# Patient Record
Sex: Female | Born: 1935 | Race: White | Hispanic: No | Smoking: Never smoker
Health system: Southern US, Community
[De-identification: ages and names within clinical notes are randomized; demographics above are authoritative.]

## PROBLEM LIST (undated history)

## (undated) DIAGNOSIS — C189 Malignant neoplasm of colon, unspecified: Secondary | ICD-10-CM

## (undated) DIAGNOSIS — J449 Chronic obstructive pulmonary disease, unspecified: Secondary | ICD-10-CM

## (undated) DIAGNOSIS — D509 Iron deficiency anemia, unspecified: Secondary | ICD-10-CM

## (undated) DIAGNOSIS — I739 Peripheral vascular disease, unspecified: Secondary | ICD-10-CM

## (undated) DIAGNOSIS — I10 Essential (primary) hypertension: Secondary | ICD-10-CM

## (undated) DIAGNOSIS — E785 Hyperlipidemia, unspecified: Secondary | ICD-10-CM

## (undated) DIAGNOSIS — G4730 Sleep apnea, unspecified: Secondary | ICD-10-CM

## (undated) DIAGNOSIS — I2510 Atherosclerotic heart disease of native coronary artery without angina pectoris: Secondary | ICD-10-CM

## (undated) DIAGNOSIS — N289 Disorder of kidney and ureter, unspecified: Secondary | ICD-10-CM

## (undated) DIAGNOSIS — K219 Gastro-esophageal reflux disease without esophagitis: Secondary | ICD-10-CM

## (undated) DIAGNOSIS — I509 Heart failure, unspecified: Secondary | ICD-10-CM

## (undated) DIAGNOSIS — G629 Polyneuropathy, unspecified: Secondary | ICD-10-CM

## (undated) DIAGNOSIS — F329 Major depressive disorder, single episode, unspecified: Secondary | ICD-10-CM

## (undated) HISTORY — PX: MITRAL VALVE REPLACEMENT: SHX147

## (undated) HISTORY — PX: HERNIA REPAIR: SHX51

## (undated) HISTORY — PX: CARDIAC SURGERY: SHX584

---

## 2020-07-01 ENCOUNTER — Inpatient Hospital Stay
Admission: EM | Admit: 2020-07-01 | Discharge: 2020-07-05 | DRG: 125 | Disposition: A | Discharge status: Skilled Nursing Facility | Payer: Medicare Other | Source: Ambulatory Visit | Attending: Internal Medicine | Admitting: Internal Medicine

## 2020-07-01 ENCOUNTER — Emergency Department: Payer: Medicare Other

## 2020-07-01 ENCOUNTER — Other Ambulatory Visit: Payer: Self-pay

## 2020-07-01 ENCOUNTER — Inpatient Hospital Stay: Payer: Medicare Other

## 2020-07-01 DIAGNOSIS — Z888 Allergy status to other drugs, medicaments and biological substances status: Secondary | ICD-10-CM

## 2020-07-01 DIAGNOSIS — N289 Disorder of kidney and ureter, unspecified: Secondary | ICD-10-CM | POA: Diagnosis present

## 2020-07-01 DIAGNOSIS — H5462 Unqualified visual loss, left eye, normal vision right eye: Secondary | ICD-10-CM | POA: Diagnosis present

## 2020-07-01 DIAGNOSIS — J449 Chronic obstructive pulmonary disease, unspecified: Secondary | ICD-10-CM | POA: Diagnosis present

## 2020-07-01 DIAGNOSIS — Z880 Allergy status to penicillin: Secondary | ICD-10-CM

## 2020-07-01 DIAGNOSIS — Z85038 Personal history of other malignant neoplasm of large intestine: Secondary | ICD-10-CM | POA: Diagnosis not present

## 2020-07-01 DIAGNOSIS — G629 Polyneuropathy, unspecified: Secondary | ICD-10-CM | POA: Diagnosis present

## 2020-07-01 DIAGNOSIS — I509 Heart failure, unspecified: Secondary | ICD-10-CM | POA: Diagnosis present

## 2020-07-01 DIAGNOSIS — Z885 Allergy status to narcotic agent status: Secondary | ICD-10-CM

## 2020-07-01 DIAGNOSIS — F329 Major depressive disorder, single episode, unspecified: Secondary | ICD-10-CM | POA: Diagnosis present

## 2020-07-01 DIAGNOSIS — I2510 Atherosclerotic heart disease of native coronary artery without angina pectoris: Secondary | ICD-10-CM | POA: Diagnosis present

## 2020-07-01 DIAGNOSIS — Z7901 Long term (current) use of anticoagulants: Secondary | ICD-10-CM | POA: Diagnosis not present

## 2020-07-01 DIAGNOSIS — I739 Peripheral vascular disease, unspecified: Secondary | ICD-10-CM | POA: Diagnosis present

## 2020-07-01 DIAGNOSIS — Z79899 Other long term (current) drug therapy: Secondary | ICD-10-CM | POA: Diagnosis not present

## 2020-07-01 DIAGNOSIS — K219 Gastro-esophageal reflux disease without esophagitis: Secondary | ICD-10-CM | POA: Diagnosis present

## 2020-07-01 DIAGNOSIS — R791 Abnormal coagulation profile: Secondary | ICD-10-CM | POA: Diagnosis present

## 2020-07-01 DIAGNOSIS — Z952 Presence of prosthetic heart valve: Secondary | ICD-10-CM

## 2020-07-01 DIAGNOSIS — Z8673 Personal history of transient ischemic attack (TIA), and cerebral infarction without residual deficits: Secondary | ICD-10-CM | POA: Diagnosis not present

## 2020-07-01 DIAGNOSIS — H34232 Retinal artery branch occlusion, left eye: Secondary | ICD-10-CM | POA: Diagnosis not present

## 2020-07-01 DIAGNOSIS — Z20822 Contact with and (suspected) exposure to covid-19: Secondary | ICD-10-CM | POA: Diagnosis present

## 2020-07-01 DIAGNOSIS — H539 Unspecified visual disturbance: Secondary | ICD-10-CM | POA: Diagnosis present

## 2020-07-01 DIAGNOSIS — G4733 Obstructive sleep apnea (adult) (pediatric): Secondary | ICD-10-CM | POA: Diagnosis present

## 2020-07-01 DIAGNOSIS — D509 Iron deficiency anemia, unspecified: Secondary | ICD-10-CM | POA: Diagnosis present

## 2020-07-01 DIAGNOSIS — E785 Hyperlipidemia, unspecified: Secondary | ICD-10-CM | POA: Diagnosis present

## 2020-07-01 HISTORY — DX: Malignant neoplasm of colon, unspecified: C18.9

## 2020-07-01 HISTORY — DX: Major depressive disorder, single episode, unspecified: F32.9

## 2020-07-01 HISTORY — DX: Disorder of kidney and ureter, unspecified: N28.9

## 2020-07-01 HISTORY — DX: Essential (primary) hypertension: I10

## 2020-07-01 HISTORY — DX: Atherosclerotic heart disease of native coronary artery without angina pectoris: I25.10

## 2020-07-01 HISTORY — DX: Peripheral vascular disease, unspecified: I73.9

## 2020-07-01 HISTORY — DX: Hyperlipidemia, unspecified: E78.5

## 2020-07-01 HISTORY — DX: Polyneuropathy, unspecified: G62.9

## 2020-07-01 HISTORY — DX: Iron deficiency anemia, unspecified: D50.9

## 2020-07-01 HISTORY — DX: Sleep apnea, unspecified: G47.30

## 2020-07-01 HISTORY — DX: Heart failure, unspecified: I50.9

## 2020-07-01 HISTORY — DX: Chronic obstructive pulmonary disease, unspecified: J44.9

## 2020-07-01 HISTORY — DX: Gastro-esophageal reflux disease without esophagitis: K21.9

## 2020-07-01 LAB — COMPREHENSIVE METABOLIC PANEL
ALT: 22 U/L (ref 0–44)
AST: 30 U/L (ref 15–41)
Albumin: 4.5 g/dL (ref 3.5–5.0)
Alkaline Phosphatase: 42 U/L (ref 38–126)
Anion gap: 10 (ref 5–15)
BUN: 15 mg/dL (ref 8–23)
CO2: 28 mmol/L (ref 22–32)
Calcium: 9.8 mg/dL (ref 8.9–10.3)
Chloride: 102 mmol/L (ref 98–111)
Creatinine, Ser: 0.8 mg/dL (ref 0.44–1.00)
GFR, Estimated: >60 mL/min (ref >60)
Glucose, Bld: 135 mg/dL — ABNORMAL HIGH (ref 70–99)
Potassium: 4 mmol/L (ref 3.5–5.1)
Sodium: 140 mmol/L (ref 135–145)
Total Bilirubin: 1 mg/dL (ref 0.3–1.2)
Total Protein: 8 g/dL (ref 6.5–8.1)

## 2020-07-01 LAB — CBC
HCT: 38.3 % (ref 36.0–46.0)
Hemoglobin: 12.5 g/dL (ref 12.0–15.0)
MCH: 31 pg (ref 26.0–34.0)
MCHC: 32.6 g/dL (ref 30.0–36.0)
MCV: 95 fL (ref 80.0–100.0)
Platelets: 226 10*3/uL (ref 150–400)
RBC: 4.03 MIL/uL (ref 3.87–5.11)
RDW: 13.6 % (ref 11.5–15.5)
WBC: 6.5 10*3/uL (ref 4.0–10.5)
nRBC: 0 % (ref 0.0–0.2)

## 2020-07-01 LAB — DIFFERENTIAL
Abs Immature Granulocytes: 0.02 10*3/uL (ref 0.00–0.07)
Basophils Absolute: 0 10*3/uL (ref 0.0–0.1)
Basophils Relative: 1 %
Eosinophils Absolute: 0.1 10*3/uL (ref 0.0–0.5)
Eosinophils Relative: 1 %
Immature Granulocytes: 0 %
Lymphocytes Relative: 30 %
Lymphs Abs: 2 10*3/uL (ref 0.7–4.0)
Monocytes Absolute: 0.6 10*3/uL (ref 0.1–1.0)
Monocytes Relative: 9 %
Neutro Abs: 3.9 10*3/uL (ref 1.7–7.7)
Neutrophils Relative %: 59 %

## 2020-07-01 LAB — SEDIMENTATION RATE: Sed Rate: 14 mm/hr (ref 0–30)

## 2020-07-01 LAB — APTT: aPTT: 39 seconds — ABNORMAL HIGH (ref 24–36)

## 2020-07-01 LAB — RESP PANEL BY RT-PCR (FLU A&B, COVID) ARPGX2
Influenza A by PCR: NEGATIVE
Influenza B by PCR: NEGATIVE
SARS Coronavirus 2 by RT PCR: NEGATIVE

## 2020-07-01 LAB — CBG MONITORING, ED: Glucose-Capillary: 119 mg/dL — ABNORMAL HIGH (ref 70–99)

## 2020-07-01 LAB — PROTIME-INR
INR: 2.2 — ABNORMAL HIGH (ref 0.8–1.2)
Prothrombin Time: 23.7 seconds — ABNORMAL HIGH (ref 11.4–15.2)

## 2020-07-01 MED ORDER — ENOXAPARIN SODIUM 100 MG/ML ~~LOC~~ SOLN
1.0000 mg/kg | Freq: Two times a day (BID) | SUBCUTANEOUS | Status: DC
  Administered 2020-07-01 – 2020-07-02 (×2): 95 mg via SUBCUTANEOUS
  Filled 2020-07-01 (×3): qty 1

## 2020-07-01 MED ORDER — ACETAMINOPHEN 650 MG RE SUPP: 650.0000 mg | RECTAL | Status: DC | PRN

## 2020-07-01 MED ORDER — ACETAMINOPHEN 160 MG/5ML PO SOLN
650.0000 mg | ORAL | Status: DC | PRN
  Filled 2020-07-01: qty 20.3

## 2020-07-01 MED ORDER — SENNOSIDES-DOCUSATE SODIUM 8.6-50 MG PO TABS: 1.0000 | ORAL_TABLET | Freq: Every evening | ORAL | Status: DC | PRN

## 2020-07-01 MED ORDER — STROKE: EARLY STAGES OF RECOVERY BOOK: Freq: Once | Status: DC

## 2020-07-01 MED ORDER — ASPIRIN 300 MG RE SUPP: 300.0000 mg | Freq: Every day | RECTAL | Status: DC

## 2020-07-01 MED ORDER — ACETAMINOPHEN 325 MG PO TABS
650.0000 mg | ORAL_TABLET | ORAL | Status: DC | PRN
  Administered 2020-07-02 – 2020-07-04 (×3): 650 mg via ORAL
  Filled 2020-07-01 (×3): qty 2

## 2020-07-01 MED ORDER — ASPIRIN 325 MG PO TABS
325.0000 mg | ORAL_TABLET | Freq: Every day | ORAL | Status: DC
  Administered 2020-07-01 – 2020-07-02 (×2): 325 mg via ORAL
  Filled 2020-07-01 (×2): qty 1

## 2020-07-01 MED ORDER — SODIUM CHLORIDE 0.9% FLUSH
3.0000 mL | Freq: Once | INTRAVENOUS | Status: AC
Start: 2020-07-01 — End: 2020-07-04
  Administered 2020-07-04: 17:00:00 3 mL via INTRAVENOUS

## 2020-07-01 MED ORDER — SODIUM CHLORIDE 0.9 % IV SOLN: INTRAVENOUS | Status: DC

## 2020-07-01 NOTE — ED Notes (Signed)
meds given  Iv fluids infusing.

## 2020-07-01 NOTE — ED Notes (Signed)
Iv infiltrated.  Iv removed.

## 2020-07-01 NOTE — ED Triage Notes (Signed)
Pt was sent to the ED via POV from [facility] with c/o partial vision loss in the left eye , the lower half is blocked since last week. Denies any pain or other sx. No weakness or change in speech

## 2020-07-01 NOTE — ED Notes (Signed)
Pt return from mri.  Pt alert  Speech clear.  Pt in hallway bed.  Iv in place.

## 2020-07-01 NOTE — ED Provider Notes (Signed)
[facility] Emergency Department Provider Note    ____________________________________________   I have reviewed the triage vital signs and the nursing notes.   HISTORY  Chief Complaint Loss of Vision   History limited by: Not Limited   HPI Jennifer Guerra is a 86 y.o. female who presents to the emergency department today from [facility] because of concerns for possible branch retinal artery occlusion.  She did go to the [facility] today because of vision loss in the left eye.  She states roughly 1 week ago she noticed that she lost vision to the lower half of her field of vision in her left eye.  This was not accompanied by any pain.  The patient denies any other acute weakness or numbness in her extremities.  Went to [facility] today and after evaluation they were concerned for BRAO and sent her to the ED for further work up and evaluation.  Records reviewed. Per medical record review patient has a history of COPD, CHF, HLD, HTN. Mitral valve replacement.   Past Medical History:  Diagnosis Date  . CHF (congestive heart failure) ([place])   . Colon cancer ([place])   . COPD (chronic obstructive pulmonary disease) ([place])   . Coronary artery disease   . GERD (gastroesophageal reflux disease)   . Hyperlipemia   . Hypertension   . Iron deficiency anemia   . Major depression   . Neuropathy   . Peripheral vascular disease ([place])   . Renal disorder   . Sleep apnea     There are no problems to display for this patient.   Past Surgical History:  Procedure Laterality Date  . CARDIAC SURGERY    . HERNIA REPAIR    . MITRAL VALVE REPLACEMENT      Prior to Admission medications   Not on File    Allergies Phenergan [promethazine], Codeine, and Penicillins  No family history on file.  Social History Social History   Tobacco Use  . Smoking status: Never Smoker  . Smokeless tobacco: Never Used  Substance Use Topics  . Alcohol use: Not  Currently  . Drug use: Not Currently    Review of Systems Constitutional: No fever/chills Eyes: Positive for left vision change. ENT: No sore throat. Cardiovascular: Denies chest pain. Respiratory: Denies shortness of breath. Gastrointestinal: No abdominal pain.  No nausea, no vomiting.  No diarrhea.   Genitourinary: Negative for dysuria. Musculoskeletal: Negative for back pain. Skin: Negative for rash. Neurological: Negative for headaches, focal weakness or numbness.  ____________________________________________   PHYSICAL EXAM:  VITAL SIGNS: ED Triage Vitals  Enc Vitals Group     BP 07/01/20 1344 (!) 195/56     Pulse Rate 07/01/20 1344 66     Resp 07/01/20 1344 20     Temp 07/01/20 1344 98.7 F (37.1 C)     Temp Source 07/01/20 1344 Oral     SpO2 07/01/20 1344 98 %     Weight 07/01/20 1344 210 lb (95.3 kg)     Height 07/01/20 1344 5\' 2"  (1.575 m)     Head Circumference --      Peak Flow --      Pain Score 07/01/20 1400 0   Constitutional: Alert and oriented.  Eyes: Conjunctivae are normal.  ENT      Head: Normocephalic and atraumatic.      Nose: No congestion/rhinnorhea.      Mouth/Throat: Mucous membranes are moist.      Neck: No stridor. Hematological/Lymphatic/Immunilogical:  No cervical lymphadenopathy. Cardiovascular: Normal rate, regular rhythm.  No murmurs, rubs, or gallops.  Respiratory: Normal respiratory effort without tachypnea nor retractions. Breath sounds are clear and equal bilaterally. No wheezes/rales/rhonchi. Gastrointestinal: Soft and non tender. No rebound. No guarding.  Genitourinary: Deferred Musculoskeletal: Normal range of motion in all extremities. No lower extremity edema. Neurologic:  Normal speech and language. No gross focal neurologic deficits are appreciated.  Skin:  Skin is warm, dry and intact. No rash noted. Psychiatric: Mood and affect are normal. Speech and behavior are normal. Patient exhibits appropriate insight and  judgment.  ____________________________________________    LABS (pertinent positives/negatives)  CMP wnl except glu 135 CBC wbc 6.5, hgb 12.5, plt 226 INR 2.2 ____________________________________________   EKG  I, Nance Pear, attending physician, personally viewed and interpreted this EKG  EKG Time: 1353 Rate: 69 Rhythm: sinus rhythm vs possible accelerated junctional Axis: normal Intervals: qtc 475 QRS: narrow, q waves v1 ST changes: no st elevation Impression: abnormal ekg  ____________________________________________    RADIOLOGY  CT head No acute abnormality  MRI brain No acute intracranial process, chronic left thalamic lacunar insult. ____________________________________________   PROCEDURES  Procedures  ____________________________________________   INITIAL IMPRESSION / ASSESSMENT AND PLAN / ED COURSE  Pertinent labs & imaging results that were available during my care of the patient were reviewed by me and considered in my medical decision making (see chart for details).  Patient presented to the emergency department today because of concerns for possible branch retinal artery occlusion found at [facility] today where she went for left eye vision change.  MRI performed from triage showed an old stroke but no acute stroke.  However given concern for BRA O did discuss with patient for stroke work-up.  She does have history of mitral valve replacement and is on warfarin INR today 2.2.  Patient states her goal is 2.5-3.5.  Will plan on admission for further stroke risk work-up.  ____________________________________________   FINAL CLINICAL IMPRESSION(S) / ED DIAGNOSES  Final diagnoses:  Vision changes     Note: This dictation was prepared with Dragon dictation. Any transcriptional errors that result from this process are unintentional     Nance Pear, MD 07/01/20 2057

## 2020-07-01 NOTE — ED Notes (Signed)
Pt talking on cell phone in hallway bed.  Pt alert  Speech clear.

## 2020-07-01 NOTE — Consult Note (Signed)
ANTICOAGULATION CONSULT NOTE  Pharmacy Consult for Enoxaparin Indication: hx of MVR  Allergies  Allergen Reactions   Phenergan [Promethazine] Anaphylaxis   Codeine Nausea And Vomiting   Penicillins Rash    Patient Measurements: Height: 5\' 2"  (157.5 cm) Weight: 95.3 kg (210 lb) IBW/kg (Calculated) : 50.1  Vital Signs: Temp: 98.7 F (37.1 C) (12/14 1344) Temp Source: Oral (12/14 1344) BP: 204/79 (12/14 2035) Pulse Rate: 69 (12/14 2035)  Labs: Recent Labs    07/01/20 1354  HGB 12.5  HCT 38.3  PLT 226  APTT 39*  LABPROT 23.7*  INR 2.2*  CREATININE 0.80    Estimated Creatinine Clearance: 54.3 mL/min (by C-G formula based on SCr of 0.8 mg/dL).   Medical History: Past Medical History:  Diagnosis Date   CHF (congestive heart failure) (HCC)    Colon cancer (H[facility])    COPD (chronic obstructive pulmonary disease) (H[facility])    Coronary artery disease    GERD (gastroesophageal reflux disease)    Hyperlipemia    Hypertension    Iron deficiency anemia    Major depression    Neuropathy    Peripheral vascular disease (H[facility])    Renal disorder    Sleep apnea     Medications:  (Not in a hospital admission)  Scheduled:   sodium chloride flush  3 mL Intravenous Once   Infusions:   PRN:  Anti-infectives (From admission, onward)   None      Assessment: Pharmacy consulted to start enoxaparin with a hx of MVR. Concerns for possible branch retinal artery occulusion. Med rec not reviewed. F/u with warfarin dose. INR 2. CBC stable.   Goal of Therapy:  Monitor platelets by anticoagulation protocol: Yes   Plan:  Lovenox 1 mg/kg q12H.   Oswald Hillock, PharmD, BCPS 07/01/2020,9:55 PM

## 2020-07-01 NOTE — ED Notes (Signed)
Iv placed by iv team.  Lab called for labs.

## 2020-07-01 NOTE — H&P (Signed)
History and Physical   [facility] [place] [facility] Admission History and Physical [employer], D.O.    Patient Name: Jennifer Guerra MR#: [id number] Date of Birth: 04-10-1932 Date of Admission: 07/01/2020  Referring MD/NP/PA: Dr. Archie Balboa Primary Care Physician: No primary care provider on file.  Chief Complaint:  Chief Complaint  Patient presents with  . Loss of Vision    HPI: Raedyn Klinck is a 86 y.o. female with a known history of CHF, COPD, CAD, HTN, HLD, GERD, IDA, depression, PVD, OSA, MVR on Coumadin (goal INR 2.5-3.5, today 2.2).  presents to the emergency department for evaluation of vision loss.  Patient was in a usual state of health until one week ago she noticed painless left lower vision field loss. . She saw the eye doctor today who sent her to ER for CVA workup and concern for arterial occlusion.   Only complaint at present is diffuse body aches from sitting in chair,.   Patient denies fevers/chills, weakness, dizziness, chest pain, shortness of breath, N/V/C/D, abdominal pain, dysuria/frequency, changes in mental status.    Otherwise there has been no change in status. Patient has been taking medication as prescribed and there has been no recent change in medication or diet.  No recent antibiotics.  There has been no recent illness, hospitalizations, travel or sick contacts.    EMS/ED Course:Medical admission has been requested for further management of CVA workup.  Review of Systems:  CONSTITUTIONAL: No fever/chills, fatigue, weakness, weight gain/loss, headache. EYES: Positive left eye vision loss. No blurry or double vision. ENT: No tinnitus, postnasal drip, redness or soreness of the oropharynx. RESPIRATORY: No cough, dyspnea, wheeze.  No hemoptysis.  CARDIOVASCULAR: No chest pain, palpitations, syncope, orthopnea. No lower extremity edema.  GASTROINTESTINAL: No nausea, vomiting, abdominal pain, diarrhea, constipation.  No hematemesis, melena or  hematochezia. GENITOURINARY: No dysuria, frequency, hematuria. ENDOCRINE: No polyuria or nocturia. No heat or cold intolerance. HEMATOLOGY: No anemia, bruising, bleeding. INTEGUMENTARY: No rashes, ulcers, lesions. MUSCULOSKELETAL: No arthritis, gout, dyspnea. NEUROLOGIC: No numbness, tingling, ataxia, seizure-type activity, weakness. PSYCHIATRIC: No anxiety, depression, insomnia.   Past Medical History:  Diagnosis Date  . CHF (congestive heart failure) ([place])   . Colon cancer ([place])   . COPD (chronic obstructive pulmonary disease) ([place])   . Coronary artery disease   . GERD (gastroesophageal reflux disease)   . Hyperlipemia   . Hypertension   . Iron deficiency anemia   . Major depression   . Neuropathy   . Peripheral vascular disease ([place])   . Renal disorder   . Sleep apnea     Past Surgical History:  Procedure Laterality Date  . CARDIAC SURGERY    . HERNIA REPAIR    . MITRAL VALVE REPLACEMENT       reports that she has never smoked. She has never used smokeless tobacco. She reports previous alcohol use. She reports previous drug use.  Allergies  Allergen Reactions  . Phenergan [Promethazine] Anaphylaxis  . Codeine Nausea And Vomiting  . Penicillins Rash    No family history on file.  Prior to Admission medications   Not on File    Physical Exam: Vitals:   07/01/20 1344 07/01/20 2035  BP: (!) 195/56 (!) 204/79  Pulse: 66 69  Resp: 20 20  Temp: 98.7 F (37.1 C)   TempSrc: Oral   SpO2: 98% 98%  Weight: 95.3 kg   Height: 5\' 2"  (1.575 m)     GENERAL: 86 y.o.-year-old female patient, well-developed, well-nourished lying  in the bed in no acute distress.  Pleasant and cooperative.   HEENT: Head atraumatic, normocephalic. Pupils equal. Mucus membranes moist. NECK: Supple. No JVD. CHEST: Normal breath sounds bilaterally. No wheezing, rales, rhonchi or crackles. No use of accessory muscles of respiration.  No reproducible chest wall tenderness.  CARDIOVASCULAR:  S1, S2 normal. No murmurs, rubs, or gallops. Cap refill <2 seconds. Pulses intact distally.  ABDOMEN: Soft, nondistended, nontender. No rebound, guarding, rigidity. Normoactive bowel sounds present in all four quadrants.  EXTREMITIES: No pedal edema, cyanosis, or clubbing. No calf tenderness or Homan's sign.  NEUROLOGIC: The patient is alert and oriented x 3. Cranial nerves II through XII are grossly intact with no focal sensorimotor deficit. PSYCHIATRIC:  Normal affect, mood, thought content. SKIN: Warm, dry, and intact without obvious rash, lesion, or ulcer.    Labs on Admission:  CBC: Recent Labs  Lab 07/01/20 1354  WBC 6.5  NEUTROABS 3.9  HGB 12.5  HCT 38.3  MCV 95.0  PLT 741   Basic Metabolic Panel: Recent Labs  Lab 07/01/20 1354  NA 140  K 4.0  CL 102  CO2 28  GLUCOSE 135*  BUN 15  CREATININE 0.80  CALCIUM 9.8   GFR: Estimated Creatinine Clearance: 54.3 mL/min (by C-G formula based on SCr of 0.8 mg/dL). Liver Function Tests: Recent Labs  Lab 07/01/20 1354  AST 30  ALT 22  ALKPHOS 42  BILITOT 1.0  PROT 8.0  ALBUMIN 4.5   No results for input(s): LIPASE, AMYLASE in the last 168 hours. No results for input(s): AMMONIA in the last 168 hours. Coagulation Profile: Recent Labs  Lab 07/01/20 1354  INR 2.2*   Cardiac Enzymes: No results for input(s): CKTOTAL, CKMB, CKMBINDEX, TROPONINI in the last 168 hours. BNP (last 3 results) No results for input(s): PROBNP in the last 8760 hours. HbA1C: No results for input(s): HGBA1C in the last 72 hours. CBG: Recent Labs  Lab 07/01/20 1358  GLUCAP 119*   Lipid Profile: No results for input(s): CHOL, HDL, LDLCALC, TRIG, CHOLHDL, LDLDIRECT in the last 72 hours. Thyroid Function Tests: No results for input(s): TSH, T4TOTAL, FREET4, T3FREE, THYROIDAB in the last 72 hours. Anemia Panel: No results for input(s): VITAMINB12, FOLATE, FERRITIN, TIBC, IRON, RETICCTPCT in the last 72 hours. Urine analysis: No results  found for: COLORURINE, APPEARANCEUR, LABSPEC, PHURINE, GLUCOSEU, HGBUR, BILIRUBINUR, KETONESUR, PROTEINUR, UROBILINOGEN, NITRITE, LEUKOCYTESUR Sepsis Labs: @LABRCNTIP (procalcitonin:4,lacticidven:4) )No results found for this or any previous visit (from the past 240 hour(s)).   Radiological Exams on Admission: CT HEAD WO CONTRAST  Result Date: 07/01/2020 CLINICAL DATA:  Neuro deficit, acute, stroke suspected. Additional history provided: Partial vision loss left eye (symptoms since last week). EXAM: CT HEAD WITHOUT CONTRAST TECHNIQUE: Contiguous axial images were obtained from the base of the skull through the vertex without intravenous contrast. COMPARISON:  No pertinent prior exams available for comparison. FINDINGS: Brain: Mild cerebral and cerebellar atrophy. There is no acute intracranial hemorrhage. No demarcated cortical infarct. No extra-axial fluid collection. No evidence of intracranial mass. No midline shift. Vascular: No hyperdense vessel.  Atherosclerotic calcifications. Skull: Normal. Negative for fracture or focal lesion. Sinuses/Orbits: Visualized orbits show no acute finding. No significant paranasal sinus disease at the imaged levels. IMPRESSION: No CT evidence of acute intracranial abnormality. Mild generalized parenchymal atrophy. Electronically Signed   By: Kellie Simmering DO   On: 07/01/2020 14:52   MR BRAIN WO CONTRAST  Result Date: 07/01/2020 CLINICAL DATA:  Neuro deficit, acute, stroke suspected partial loss of vision  since last week EXAM: MRI HEAD WITHOUT CONTRAST TECHNIQUE: Multiplanar, multiecho pulse sequences of the brain and surrounding structures were obtained without intravenous contrast. COMPARISON:  07/01/2020 FINDINGS: Brain: No diffusion-weighted signal abnormality. No intracranial hemorrhage. No midline shift, ventriculomegaly or extra-axial fluid collection. No mass lesion. Minimal chronic microvascular ischemic changes. Chronic left thalamic lacunar insult. Vascular:  Normal flow voids. Skull and upper cervical spine: Normal marrow signal. Sinuses/Orbits: Sequela of bilateral lens replacement. Pneumatized paranasal sinuses. Trace mastoid free fluid. Other: None. IMPRESSION: No acute intracranial process. Chronic left thalamic lacunar insult. Minimal chronic microvascular ischemic changes. Electronically Signed   By: Primitivo Gauze M.D.   On: 07/01/2020 19:36    EKG: Normal sinus rhythm at 69 bpm with normal axis and nonspecific ST-T wave changes.   Assessment/Plan  This is a 86 y.o. female with a history of CHF, COPD, CAD, HTN, HLD, GERD, IDA, depression, PVD, OSA, MVR on Coumadin (goal INR 2.5-3.5, today 2.2).   now being admitted with:  #. L visual field loss in setting of MVR and subther INR, rule out CVA -  - Admit telemetry observation for neuro workup including: - Studies: MRA/MRI, Echo, Carotids - Labs: CBC, BMP, Lipids, TFTs, A1C - Nursing: Neurochecks, O2, dysphagia screen, permissive hypertension.  - Consults: Neurology, PT/OT, S/S consults.  - Meds: Daily aspirin 81mg .   - Fluids: IVNS@75cc /hr.   - Routine DVT Px: with Lovenox, SCDs, early ambulation   #. History of CHF, COPD, CAD, HTN, HLD, GERD, IDA, depression, PVD, OSA, MVR on Coumadin (goal INR 2.5-3.5, today 2.2).    - Reconcile home meds.   Admission status: IP IV Fluids: NS Diet/Nutrition: Heart healthy Consults called: Neuro to be called  DVT Px: Lovenox, SCDs and early ambulation. Code Status: Full Code  Disposition Plan: To home in 1-2 days  All the records are reviewed and case discussed with ED provider. Management plans discussed with the patient and/or family who express understanding and agree with plan of care.  Lamarion Mcevers D.O. on 07/01/2020 at 9:04 PM CC: Primary care physician; No primary care provider on file.   07/01/2020, 9:04 PM

## 2020-07-02 ENCOUNTER — Inpatient Hospital Stay
Admit: 2020-07-02 | Discharge: 2020-07-02 | Disposition: A | Discharge status: Home / Self Care | Payer: Medicare Other | Attending: Internal Medicine | Admitting: Internal Medicine

## 2020-07-02 ENCOUNTER — Encounter: Payer: Self-pay | Admitting: Internal Medicine

## 2020-07-02 ENCOUNTER — Inpatient Hospital Stay: Payer: Medicare Other

## 2020-07-02 DIAGNOSIS — H34232 Retinal artery branch occlusion, left eye: Secondary | ICD-10-CM

## 2020-07-02 DIAGNOSIS — H539 Unspecified visual disturbance: Secondary | ICD-10-CM

## 2020-07-02 LAB — URINALYSIS, COMPLETE (UACMP) WITH MICROSCOPIC
Bilirubin Urine: NEGATIVE
Glucose, UA: NEGATIVE mg/dL
Ketones, ur: NEGATIVE mg/dL
Leukocytes,Ua: NEGATIVE
Nitrite: POSITIVE — AB
Protein, ur: NEGATIVE mg/dL
Specific Gravity, Urine: 1.01 (ref 1.005–1.030)
Squamous Epithelial / HPF: NONE SEEN (ref 0–5)
WBC, UA: NONE SEEN WBC/hpf (ref 0–5)
pH: 7 (ref 5.0–8.0)

## 2020-07-02 LAB — C-REACTIVE PROTEIN: CRP: 0.6 mg/dL (ref <1.0)

## 2020-07-02 LAB — URINE DRUG SCREEN, QUALITATIVE (ARMC ONLY)
Amphetamines, Ur Screen: NOT DETECTED
Barbiturates, Ur Screen: NOT DETECTED
Benzodiazepine, Ur Scrn: NOT DETECTED
Cannabinoid 50 Ng, Ur ~~LOC~~: NOT DETECTED
Cocaine Metabolite,Ur ~~LOC~~: NOT DETECTED
MDMA (Ecstasy)Ur Screen: NOT DETECTED
Methadone Scn, Ur: NOT DETECTED
Opiate, Ur Screen: NOT DETECTED
Phencyclidine (PCP) Ur S: NOT DETECTED
Tricyclic, Ur Screen: NOT DETECTED

## 2020-07-02 LAB — PROTIME-INR
INR: 2.1 — ABNORMAL HIGH (ref 0.8–1.2)
Prothrombin Time: 22.6 seconds — ABNORMAL HIGH (ref 11.4–15.2)

## 2020-07-02 LAB — HEMOGLOBIN A1C
Hgb A1c MFr Bld: 5.4 % (ref 4.8–5.6)
Mean Plasma Glucose: 108.28 mg/dL

## 2020-07-02 LAB — LIPID PANEL
Cholesterol: 169 mg/dL (ref 0–200)
HDL: 48 mg/dL (ref >40)
LDL Cholesterol: 88 mg/dL (ref 0–99)
Total CHOL/HDL Ratio: 3.5 RATIO
Triglycerides: 167 mg/dL — ABNORMAL HIGH (ref <150)
VLDL: 33 mg/dL (ref 0–40)

## 2020-07-02 MED ORDER — MELATONIN 5 MG PO TABS
10.0000 mg | ORAL_TABLET | Freq: Every day | ORAL | Status: DC
  Administered 2020-07-02 – 2020-07-04 (×3): 10 mg via ORAL
  Filled 2020-07-02 (×3): qty 2

## 2020-07-02 MED ORDER — ATORVASTATIN CALCIUM 20 MG PO TABS
20.0000 mg | ORAL_TABLET | Freq: Every day | ORAL | Status: DC
  Administered 2020-07-02 – 2020-07-04 (×3): 20 mg via ORAL
  Filled 2020-07-02 (×3): qty 1

## 2020-07-02 MED ORDER — BUSPIRONE HCL 10 MG PO TABS
10.0000 mg | ORAL_TABLET | Freq: Three times a day (TID) | ORAL | Status: DC
  Administered 2020-07-02 – 2020-07-05 (×8): 10 mg via ORAL
  Filled 2020-07-02 (×2): qty 1
  Filled 2020-07-02: qty 2
  Filled 2020-07-02 (×6): qty 1

## 2020-07-02 MED ORDER — POLYETHYLENE GLYCOL 3350 17 G PO PACK
17.0000 g | PACK | Freq: Every day | ORAL | Status: DC
  Administered 2020-07-02 – 2020-07-04 (×3): 17 g via ORAL
  Filled 2020-07-02 (×4): qty 1

## 2020-07-02 MED ORDER — CHLORTHALIDONE 25 MG PO TABS
25.0000 mg | ORAL_TABLET | Freq: Every day | ORAL | Status: DC
  Administered 2020-07-03 – 2020-07-05 (×3): 25 mg via ORAL
  Filled 2020-07-02 (×3): qty 1

## 2020-07-02 MED ORDER — OXYCODONE HCL 5 MG PO TABS
5.0000 mg | ORAL_TABLET | Freq: Four times a day (QID) | ORAL | Status: DC | PRN
  Administered 2020-07-03 – 2020-07-05 (×3): 5 mg via ORAL
  Filled 2020-07-02 (×3): qty 1

## 2020-07-02 MED ORDER — CHLORHEXIDINE GLUCONATE CLOTH 2 % EX PADS
6.0000 | MEDICATED_PAD | Freq: Every day | CUTANEOUS | Status: DC
  Administered 2020-07-03: 18:00:00 6 via TOPICAL
  Filled 2020-07-02: qty 6

## 2020-07-02 MED ORDER — LOSARTAN POTASSIUM 50 MG PO TABS
50.0000 mg | ORAL_TABLET | Freq: Every day | ORAL | Status: DC
  Administered 2020-07-02 – 2020-07-05 (×4): 50 mg via ORAL
  Filled 2020-07-02 (×4): qty 1

## 2020-07-02 MED ORDER — IOHEXOL 350 MG/ML SOLN
75.0000 mL | Freq: Once | INTRAVENOUS | Status: AC | PRN
  Administered 2020-07-02: 75 mL via INTRAVENOUS

## 2020-07-02 MED ORDER — WARFARIN SODIUM 5 MG PO TABS
5.0000 mg | ORAL_TABLET | Freq: Once | ORAL | Status: DC
  Filled 2020-07-02: qty 1

## 2020-07-02 MED ORDER — SERTRALINE HCL 50 MG PO TABS
200.0000 mg | ORAL_TABLET | Freq: Every day | ORAL | Status: DC
  Administered 2020-07-02 – 2020-07-05 (×4): 200 mg via ORAL
  Filled 2020-07-02 (×5): qty 4

## 2020-07-02 MED ORDER — GABAPENTIN 400 MG PO CAPS
400.0000 mg | ORAL_CAPSULE | Freq: Every day | ORAL | Status: DC
  Administered 2020-07-02 – 2020-07-04 (×3): 400 mg via ORAL
  Filled 2020-07-02 (×3): qty 1

## 2020-07-02 MED ORDER — SODIUM CHLORIDE 0.9% FLUSH: 10.0000 mL | INTRAVENOUS | Status: DC | PRN

## 2020-07-02 MED ORDER — OMEGA-3-ACID ETHYL ESTERS 1 G PO CAPS
2.0000 g | ORAL_CAPSULE | Freq: Every day | ORAL | Status: DC
  Administered 2020-07-02 – 2020-07-05 (×4): 2 g via ORAL
  Filled 2020-07-02 (×5): qty 2

## 2020-07-02 MED ORDER — GABAPENTIN 300 MG PO CAPS
300.0000 mg | ORAL_CAPSULE | Freq: Two times a day (BID) | ORAL | Status: DC
  Administered 2020-07-02 – 2020-07-05 (×6): 300 mg via ORAL
  Filled 2020-07-02 (×6): qty 1

## 2020-07-02 MED ORDER — VITAMIN D3 25 MCG (1000 UNIT) PO TABS
2000.0000 [IU] | ORAL_TABLET | Freq: Every day | ORAL | Status: DC
  Administered 2020-07-03 – 2020-07-05 (×3): 2000 [IU] via ORAL
  Filled 2020-07-02 (×6): qty 2

## 2020-07-02 MED ORDER — WARFARIN SODIUM 6 MG PO TABS: 6.0000 mg | ORAL_TABLET | Freq: Once | ORAL | Status: DC

## 2020-07-02 MED ORDER — PANTOPRAZOLE SODIUM 40 MG PO TBEC
40.0000 mg | DELAYED_RELEASE_TABLET | Freq: Every day | ORAL | Status: DC
  Administered 2020-07-02 – 2020-07-05 (×4): 40 mg via ORAL
  Filled 2020-07-02 (×5): qty 1

## 2020-07-02 MED ORDER — WARFARIN - PHARMACIST DOSING INPATIENT
Freq: Every day | Status: DC
  Filled 2020-07-02: qty 1

## 2020-07-02 MED ORDER — IOHEXOL 300 MG/ML  SOLN: 75.0000 mL | Freq: Once | INTRAMUSCULAR | Status: DC | PRN

## 2020-07-02 MED ORDER — BACLOFEN 10 MG PO TABS
5.0000 mg | ORAL_TABLET | Freq: Two times a day (BID) | ORAL | Status: DC
  Administered 2020-07-03 – 2020-07-05 (×6): 5 mg via ORAL
  Filled 2020-07-02 (×8): qty 0.5

## 2020-07-02 MED ORDER — LORATADINE 10 MG PO TABS
10.0000 mg | ORAL_TABLET | Freq: Every day | ORAL | Status: DC
  Administered 2020-07-02 – 2020-07-05 (×4): 10 mg via ORAL
  Filled 2020-07-02 (×4): qty 1

## 2020-07-02 NOTE — ED Notes (Signed)
Pt assisted to the bathroom. Pt did not want to get back in bed and is now sitting in a chair. Pt back on cardiac monitor.

## 2020-07-02 NOTE — ED Notes (Signed)
Patient was given a warm blanket per request. Patient was given the TV remote and repositioned on stretcher.

## 2020-07-02 NOTE — Consult Note (Signed)
Neurology Consultation  Reason for Consult: Visual disturbance, concern for BRAO Referring Physician: Dr. Posey Pronto  CC: Visual disturbance  History is obtained from: Patient, chart  HPI: Jennifer Guerra is a 86 y.o. female past history of CHF, mitral valve repair on Coumadin, COPD, colon cancer, major depression, peripheral vascular disease comes in for evaluation of vision loss in the left eye.  She reports last known well about a week ago when she noted that she could not see anything past a certain point in the lower visual fields only from the left eye.  She went to her ophthalmologist who was concerned for a BRAO and sent for stroke work-up -I do not have any records to review-information received from the admitting documentation. Denies any tingling numbness weakness. Denies headaches Denies any prior similar symptoms Is a resident of a nursing home   LKW: 7 days ago tpa given?: no, outside the window, also on Coumadin Premorbid modified Rankin scale (mRS):3  ROS: Obtained and negative except as noted in HPI  Past Medical History:  Diagnosis Date   CHF (congestive heart failure) (HCC)    Colon cancer (HCC)    COPD (chronic obstructive pulmonary disease) ([place])    Coronary artery disease    GERD (gastroesophageal reflux disease)    Hyperlipemia    Hypertension    Iron deficiency anemia    Major depression    Neuropathy    Peripheral vascular disease ([place])    Renal disorder    Sleep apnea    Social History:   reports that she has never smoked. She has never used smokeless tobacco. She reports previous alcohol use. She reports previous drug use.  Medications  Current Facility-Administered Medications:     stroke: mapping our early stages of recovery book, , Does not apply, Once, Hugelmeyer, Alexis, DO   0.9 %  sodium chloride infusion, , Intravenous, Continuous, Hugelmeyer, Alexis, DO, Last Rate: 75 mL/hr at 07/02/20 0645, Rate Verify at 07/02/20 0645    acetaminophen (TYLENOL) tablet 650 mg, 650 mg, Oral, Q4H PRN **OR** acetaminophen (TYLENOL) 160 MG/5ML solution 650 mg, 650 mg, Per Tube, Q4H PRN **OR** acetaminophen (TYLENOL) suppository 650 mg, 650 mg, Rectal, Q4H PRN, Hugelmeyer, Alexis, DO   aspirin suppository 300 mg, 300 mg, Rectal, Daily **OR** aspirin tablet 325 mg, 325 mg, Oral, Daily, Hugelmeyer, Alexis, DO, 325 mg at 07/02/20 2094   Chlorhexidine Gluconate Cloth 2 % PADS 6 each, 6 each, Topical, Daily, Hugelmeyer, Alexis, DO   enoxaparin (LOVENOX) injection 95 mg, 1 mg/kg, Subcutaneous, Q12H, Hugelmeyer, Alexis, DO, 95 mg at 07/02/20 0948   senna-docusate (Senokot-S) tablet 1 tablet, 1 tablet, Oral, QHS PRN, Hugelmeyer, Alexis, DO   sodium chloride flush (NS) 0.9 % injection 10-40 mL, 10-40 mL, Intracatheter, PRN, Hugelmeyer, Alexis, DO   sodium chloride flush (NS) 0.9 % injection 3 mL, 3 mL, Intravenous, Once, Nance Pear, MD   warfarin (COUMADIN) tablet 5 mg, 5 mg, Oral, ONCE-1600, Beers, Shanon Brow, RPH   Warfarin - Pharmacist Dosing Inpatient, , Does not apply, q1600, Beers, Shanon Brow, RPH  Current Outpatient Medications:    acetaminophen (TYLENOL) 500 MG tablet, Take 1,000 mg by mouth every 8 (eight) hours., Disp: , Rfl:    albuterol (VENTOLIN HFA) 108 (90 Base) MCG/ACT inhaler, Inhale 2 puffs into the lungs every 4 (four) hours as needed for wheezing or shortness of breath., Disp: , Rfl:    albuterol (VENTOLIN HFA) 108 (90 Base) MCG/ACT inhaler, Inhale 1 puff into the lungs 2 (two) times  daily., Disp: , Rfl:    ASPERCREME LIDOCAINE 4 % PTCH, Apply 1 patch topically daily., Disp: , Rfl:    atorvastatin (LIPITOR) 20 MG tablet, Take 20 mg by mouth at bedtime., Disp: , Rfl:    Baclofen 5 MG TABS, Take 5 mg by mouth 2 (two) times daily., Disp: , Rfl:    busPIRone (BUSPAR) 10 MG tablet, Take 10 mg by mouth 3 (three) times daily., Disp: , Rfl:    cetirizine (ZYRTEC) 10 MG tablet, Take 10 mg by mouth daily as needed  for allergies., Disp: , Rfl:    chlorthalidone (HYGROTON) 25 MG tablet, Take 25 mg by mouth daily., Disp: , Rfl:    Cholecalciferol 50 MCG (2000 UT) TABS, Take 2,000 Units by mouth daily., Disp: , Rfl:    Dextran 70-Hypromellose, PF, 0.1-0.3 % SOLN, Place 1 drop into both eyes 2 (two) times daily., Disp: , Rfl:    diclofenac Sodium (VOLTAREN) 1 % GEL, Apply 2 g topically at bedtime. Apply to neck, right hip and left hand, Disp: , Rfl:    gabapentin (NEURONTIN) 300 MG capsule, Take 300 mg by mouth 2 (two) times daily., Disp: , Rfl:    gabapentin (NEURONTIN) 400 MG capsule, Take 400 mg by mouth at bedtime., Disp: , Rfl:    guaiFENesin (ROBITUSSIN) 100 MG/5ML SOLN, Take 5 mLs by mouth at bedtime as needed for cough or to loosen phlegm., Disp: , Rfl:    losartan (COZAAR) 50 MG tablet, Take 50 mg by mouth daily., Disp: , Rfl:    melatonin 3 MG TABS tablet, Take 9 mg by mouth at bedtime., Disp: , Rfl:    Omega-3 1000 MG CAPS, Take 2 capsules by mouth daily., Disp: , Rfl:    omeprazole (PRILOSEC) 20 MG capsule, Take 20 mg by mouth daily., Disp: , Rfl:    oxyCODONE (OXY IR/ROXICODONE) 5 MG immediate release tablet, Take 5 mg by mouth every 6 (six) hours as needed for severe pain., Disp: , Rfl:    oxyCODONE (OXY IR/ROXICODONE) 5 MG immediate release tablet, Take 5 mg by mouth as directed. Take 5 mg once an evening, give 5 mg at bedtime, Disp: , Rfl:    polyethylene glycol (MIRALAX / GLYCOLAX) 17 g packet, Take 17 g by mouth daily., Disp: , Rfl:    senna (SENOKOT) 8.6 MG TABS tablet, Take 2 tablets by mouth at bedtime., Disp: , Rfl:    sertraline (ZOLOFT) 100 MG tablet, Take 200 mg by mouth daily., Disp: , Rfl:    sodium chloride (OCEAN) 0.65 % SOLN nasal spray, Place 1 spray into both nostrils every 12 (twelve) hours., Disp: , Rfl:    warfarin (COUMADIN) 5 MG tablet, Take 5 mg by mouth every evening., Disp: , Rfl:    Wheat Dextrin (BENEFIBER DRINK MIX PO), Take 1 Scoop by mouth at  bedtime., Disp: , Rfl:   Exam: Current vital signs: BP (!) 155/51 (BP Location: Left Arm)    Pulse 68    Temp 98.2 F (36.8 C) (Oral)    Resp 18    Ht 5\' 2"  (1.575 m)    Wt 95.3 kg    SpO2 98%    BMI 38.41 kg/m  Vital signs in last 24 hours: Temp:  [98.2 F (36.8 C)-98.7 F (37.1 C)] 98.2 F (36.8 C) (12/15 1000) Pulse Rate:  [63-70] 68 (12/15 1000) Resp:  [18-20] 18 (12/15 0400) BP: (155-204)/(51-79) 155/51 (12/15 0400) SpO2:  [97 %-98 %] 98 % (12/15 1000) Weight:  [  95.3 kg] 95.3 kg (12/14 1344) General: Awake alert in no distress HEENT: Normocephalic atraumatic Respiratory: Clear Cardiovascular: Regular rhythm, audible click Abdomen nondistended nontender Extremities warm well perfused with edema in both lower extremities Neurologic exam Awake alert oriented x3 No dysarthria No aphasia Cranial nerves: Pupils equal round reactive to light, extraocular movements intact, visual field examination shows monocular inferior altitudinal vision loss in the left eye, facial sensation intact, face symmetric, tongue and palate midline. Motor exam: Antigravity in both upper extremities without drift.  Both lower extremities are mildly weak with 4+/5 strength at hip flexors with some drift. Sensory exam: Intact light touch but has sensory loss in a stocking pattern. Coordination: No dysmetria in the upper extremities, difficult to perform on the lower extremities DTRs absent in the lower extremities at the ankle and the knee.  Week 1+ brachioradialis bilaterally NIH stroke scale: 1a Level of Conscious.: 0 1b LOC Questions: 0 1c LOC Commands: 0 2 Best Gaze: 0 3 Visual: 1 4 Facial Palsy: 0 5a Motor Arm - left: 0 5b Motor Arm - Right: 0 6a Motor Leg - Left: 1 6b Motor Leg - Right: 1 7 Limb Ataxia: 0 8 Sensory: 0 9 Best Language: 0 10 Dysarthria: 0 11 Extinct. and Inatten.: 0 TOTAL: 3  Labs I have reviewed labs in epic and the results pertinent to this consultation are:  CBC     Component Value Date/Time   WBC 6.5 07/01/2020 1354   RBC 4.03 07/01/2020 1354   HGB 12.5 07/01/2020 1354   HCT 38.3 07/01/2020 1354   PLT 226 07/01/2020 1354   MCV 95.0 07/01/2020 1354   MCH 31.0 07/01/2020 1354   MCHC 32.6 07/01/2020 1354   RDW 13.6 07/01/2020 1354   LYMPHSABS 2.0 07/01/2020 1354   MONOABS 0.6 07/01/2020 1354   EOSABS 0.1 07/01/2020 1354   BASOSABS 0.0 07/01/2020 1354    CMP     Component Value Date/Time   NA 140 07/01/2020 1354   K 4.0 07/01/2020 1354   CL 102 07/01/2020 1354   CO2 28 07/01/2020 1354   GLUCOSE 135 (H) 07/01/2020 1354   BUN 15 07/01/2020 1354   CREATININE 0.80 07/01/2020 1354   CALCIUM 9.8 07/01/2020 1354   PROT 8.0 07/01/2020 1354   ALBUMIN 4.5 07/01/2020 1354   AST 30 07/01/2020 1354   ALT 22 07/01/2020 1354   ALKPHOS 42 07/01/2020 1354   BILITOT 1.0 07/01/2020 1354   GFRNONAA >60 07/01/2020 1354    Lipid Panel     Component Value Date/Time   CHOL 169 07/02/2020 0553   TRIG 167 (H) 07/02/2020 0553   HDL 48 07/02/2020 0553   CHOLHDL 3.5 07/02/2020 0553   VLDL 33 07/02/2020 0553   LDLCALC 88 07/02/2020 0553   A1c 5.4 LDL 88 2D echo pending  Imaging I have reviewed the images obtained:  CT-scan of the brain-no acute abnormality CT head and neck-no large vessel occlusion or high-grade narrowing on the head CT.  No large vessel occlusion or high-grade narrowing or dissection or aneurysm on the neck CTA. 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  MRI examination of the brain-no acute stroke.  Concern for loss of carotid signal at the skull base-likely artifactual because of follow-up CTA does not show any concerning findings.  Assessment: 86 year old woman with above past medical history was on Coumadin for MVR, came in with INR 2.2-goal INR 2.5-3.5, with history  and examination of ophthalmology concerning for BRAO  due to the sudden onset of an inferior altitudinal defect of the left eye which was painless.  Last known normal 7 days ago hence not a candidate for intervention. No CT or MRI abnormality of the head. CT head and neck with no significant stenosis or occlusion of any of the blood vessels Branch retinal occlusion likely secondary to an embolic phenomenon due to subtherapeutic INR. Would recommend completing the work-up with an echocardiogram No significant carotid stenosis to warrant vascular surgery consultation.  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 Statin for LDL goal less than 70 A1c within goal 2D echocardiogram to rule out an LV thrombus-will probably not change the management, anticoagulation will be the ultimate treatment. Ophthalmological follow-up as an outpatient Recs relayed to Dr. Posey Pronto over secure text.  Inpatient neurology will be available as needed.  Please call with questions. -- Amie Portland, MD [facility] Pager: [phone] If 7pm to 7am, please call on call as listed on AMION.

## 2020-07-02 NOTE — Evaluation (Signed)
Occupational Therapy Evaluation Patient Details Name: Jennifer Guerra MRN: [id number] DOB: 02-02-1932 Today's Date: 07/02/2020    History of Present Illness 86 y.o. female with a known history of CHF, COPD, CAD, HTN, HLD, GERD, IDA, depression, PVD, OSA, MVR on Coumadin (goal INR 2.5-3.5, today 2.2).  presents to the emergency department for evaluation of vision loss.  Patient was in a usual state of health until one week ago she noticed painless left lower vision field loss.  Sent to ED by eye doctor, still having L eye lower field blurry   Clinical Impression   Jennifer Guerra reports she is feeling somewhat tired but is having no pain and is eager to return to the SNF where she has lived for a number of years. She states she participates in activities there each day (bingo, crafts, games, etc.), and wants to get back to those and to her friends there. She is Mod I in bed mobility and transfers (requiring increased time), is A&O x 4, UE strength and ROM WFL and grossly symmetrical bilaterally. She has a visual field cut in her lower line of vision, L eye, and with L eyelid drop compared to R. Jennifer Guerra appears to be at her baseline in terms of ADL performance and functional mobility, no further OT required during this hospitalization. She may benefit from vision-related OT once she returns to her SNF.    Follow Up Recommendations  No OT follow up    Equipment Recommendations  None recommended by OT    Recommendations for Other Services       Precautions / Restrictions Precautions Precautions: Fall Restrictions Weight Bearing Restrictions: No      Mobility Bed Mobility Overal bed mobility: Modified Independent             General bed mobility comments: requires increased time but no physical assistance    Transfers Overall transfer level: Modified independent Equipment used: Rolling walker (2 wheeled)             General transfer comment: Pt able to rise with confidence,  able to maintain static balance w/o reliance on walker on standing    Balance Overall balance assessment: Modified Independent                                         ADL either performed or assessed with clinical judgement   ADL Overall ADL's : At baseline                                             Vision Patient Visual Report: Peripheral vision impairment (L lower visual field cut)       Perception     Praxis      Pertinent Vitals/Pain Pain Assessment: No/denies pain     Hand Dominance     Extremity/Trunk Assessment Upper Extremity Assessment Upper Extremity Assessment: Overall WFL for tasks assessed   Lower Extremity Assessment Lower Extremity Assessment: Overall WFL for tasks assessed       Communication Communication Communication: No difficulties   Cognition Arousal/Alertness: Awake/alert Behavior During Therapy: WFL for tasks assessed/performed Overall Cognitive Status: Within Functional Limits for tasks assessed  General Comments       Exercises Other Exercises Other Exercises: educ role of OT, AE. Bed mobility, t/f, ambulation   Shoulder Instructions      Home Living Family/patient expects to be discharged to:: Skilled nursing facility                                        Prior Functioning/Environment Level of Independence: Independent with assistive device(s)        Comments: Pt reports that she walks around ad lib with 4WW at her facility.  At least one meal/day in dining area and participates in many of the provided activities.        OT Problem List: Decreased coordination;Impaired vision/perception      OT Treatment/Interventions:      OT Goals(Current goals can be found in the care plan section) Acute Rehab OT Goals Patient Stated Goal: go back to her SNF OT Goal Formulation: With patient Time For Goal Achievement:  07/16/20 Potential to Achieve Goals: Good  OT Frequency:     Barriers to D/C:            Co-evaluation              AM-PAC OT "6 Clicks" Daily Activity     Outcome Measure Help from another person eating meals?: None Help from another person taking care of personal grooming?: None Help from another person toileting, which includes using toliet, bedpan, or urinal?: None Help from another person bathing (including washing, rinsing, drying)?: A Little Help from another person to put on and taking off regular upper body clothing?: None Help from another person to put on and taking off regular lower body clothing?: A Little 6 Click Score: 22   End of Session Equipment Utilized During Treatment: Rolling walker  Activity Tolerance: Patient tolerated treatment well Patient left: in bed;with call bell/phone within reach  OT Visit Diagnosis: Other symptoms and signs involving the nervous system (R29.898);Muscle weakness (generalized) (M62.81)                Time: 8315-1761 OT Time Calculation (min): 23 min Charges:  OT General Charges $OT Visit: 1 Visit OT Evaluation $OT Eval Low Complexity: 1 Low OT Treatments $Self Care/Home Management : 23-37 mins  Josiah Lobo, PhD, MS, OTR/L ascom [phone] 07/02/20, 2:51 PM

## 2020-07-02 NOTE — Progress Notes (Signed)
[place] [facility] [place] NAME: Jennifer Guerra    MR#:  [id number]  DATE OF BIRTH:  01/11/1932  SUBJECTIVE:  patient and from my doctors office with left lower quadrant vision loss for few days. She is feeling about the same. No worsening or improvement in her vision. She is wondering if she can be discharged from the hospital.  No other focal deficit. No family in the room.  REVIEW OF SYSTEMS:   Review of Systems  Constitutional: Negative for chills, fever and weight loss.  HENT: Negative for ear discharge, ear pain and nosebleeds.   Eyes: Positive for blurred vision. Negative for pain and discharge.  Respiratory: Negative for sputum production, shortness of breath, wheezing and stridor.   Cardiovascular: Negative for chest pain, palpitations, orthopnea and PND.  Gastrointestinal: Negative for abdominal pain, diarrhea, nausea and vomiting.  Genitourinary: Negative for frequency and urgency.  Musculoskeletal: Negative for back pain and joint pain.  Neurological: Negative for sensory change, speech change, focal weakness and weakness.  Psychiatric/Behavioral: Negative for depression and hallucinations. The patient is not nervous/anxious.    Tolerating Diet: yes Tolerating PT: HHPT  DRUG ALLERGIES:   Allergies  Allergen Reactions  . Phenergan [Promethazine] Anaphylaxis  . Codeine Nausea And Vomiting  . Penicillins Rash    VITALS:  Blood pressure (!) 165/62, pulse 88, temperature 98.1 F (36.7 C), temperature source Oral, resp. rate 19, height 5\' 2"  (1.575 m), weight 95.3 kg, SpO2 96 %.  PHYSICAL EXAMINATION:   Physical Exam  GENERAL:  86 y.o.-year-old patient lying in the bed with no acute distress.  Left Lower quadrant vision loss LUNGS: Normal breath sounds bilaterally, no wheezing, rales, rhonchi. No use of accessory muscles of respiration.  CARDIOVASCULAR: S1, S2 normal. No murmurs, rubs, or gallops.  ABDOMEN: Soft, nontender,  nondistended. Bowel sounds present. No organomegaly or mass.  EXTREMITIES: No cyanosis, clubbing or edema b/l.    NEUROLOGIC: Cranial nerves II through XII are intact. No focal Motor or sensory deficits b/l.   PSYCHIATRIC:  patient is alert and oriented x 3.  SKIN: No obvious rash, lesion, or ulcer.   LABORATORY PANEL:  CBC Recent Labs  Lab 07/01/20 1354  WBC 6.5  HGB 12.5  HCT 38.3  PLT 226    Chemistries  Recent Labs  Lab 07/01/20 1354  NA 140  K 4.0  CL 102  CO2 28  GLUCOSE 135*  BUN 15  CREATININE 0.80  CALCIUM 9.8  AST 30  ALT 22  ALKPHOS 42  BILITOT 1.0   Cardiac Enzymes No results for input(s): TROPONINI in the last 168 hours. RADIOLOGY:  CT ANGIO HEAD W OR WO CONTRAST  Result Date: 07/02/2020 CLINICAL DATA:  Carotid artery stenosis EXAM: CT ANGIOGRAPHY HEAD AND NECK TECHNIQUE: Multidetector CT imaging of the head and neck was performed using the standard protocol during bolus administration of intravenous contrast. Multiplanar CT image reconstructions and MIPs were obtained to evaluate the vascular anatomy. Carotid stenosis measurements (when applicable) are obtained utilizing NASCET criteria, using the distal internal carotid diameter as the denominator. CONTRAST:  26mL OMNIPAQUE IOHEXOL 350 MG/ML SOLN COMPARISON:  07/01/2020. 09/03/2013 outside CT neck report. 03/15/2014 outside CT chest report. FINDINGS: CT HEAD FINDINGS Brain: No acute infarct or intracranial hemorrhage. No mass lesion. No midline shift, ventriculomegaly or extra-axial fluid collection. Chronic left thalamic lacunar insult is better demonstrated on prior MRI. Vascular: No hyperdense vessel or unexpected calcification. Bilateral skull base atherosclerotic calcifications. Skull: Negative  for fracture or focal lesion. Sinuses/Orbits: Normal orbits. Clear paranasal sinuses. No mastoid effusion. Other: None. Review of the MIP images confirms the above findings CTA NECK FINDINGS Aortic arch: Mild aortic  arch atherosclerotic calcifications. Standard branching. Imaged portion shows no evidence of aneurysm or dissection. No significant stenosis of the major arch vessel origins. Right carotid system: Patent. Carotid bifurcation atherosclerotic calcifications. No evidence of dissection, stenosis (50% or greater) or occlusion. Left carotid system: Patent. Carotid bifurcation atherosclerotic calcifications. No evidence of dissection, stenosis (50% or greater) or occlusion. Vertebral arteries: Codominant. No evidence of dissection, stenosis (50% or greater) or occlusion. Skeleton: No acute finding. Post sternotomy sequela. Multilevel spondylosis. Sequela of ACDF. 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 Upper chest: No acute finding. Review of the MIP images confirms the above findings CTA HEAD FINDINGS Anterior circulation: Patent ICAs. Patent anterior cerebral arteries. Patent MCAs. No significant stenosis, proximal occlusion, aneurysm, or vascular malformation. Posterior circulation: Patent V4 segments and proximal PICA. Patent basilar artery. Patent right PCA. Patent left PCA. Superior cerebellar arteries are patent. No significant stenosis, proximal occlusion, aneurysm, or vascular malformation. Venous sinuses: As permitted by contrast timing, patent. Anatomic variants: None. Review of the MIP images confirms the above findings IMPRESSION: Head CT: No acute intracranial process. Head CTA: No large vessel occlusion, high-grade narrowing, dissection or aneurysm. Neck CTA: No large vessel occlusion, high-grade narrowing, dissection or aneurysm. 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 Consider obtaining outside images for better assessment of interval change. Electronically Signed   By: Primitivo Gauze M.D.   On: 07/02/2020 10:00   CT HEAD WO CONTRAST  Result Date:  07/01/2020 CLINICAL DATA:  Neuro deficit, acute, stroke suspected. Additional history provided: Partial vision loss left eye (symptoms since last week). EXAM: CT HEAD WITHOUT CONTRAST TECHNIQUE: Contiguous axial images were obtained from the base of the skull through the vertex without intravenous contrast. COMPARISON:  No pertinent prior exams available for comparison. FINDINGS: Brain: Mild cerebral and cerebellar atrophy. There is no acute intracranial hemorrhage. No demarcated cortical infarct. No extra-axial fluid collection. No evidence of intracranial mass. No midline shift. Vascular: No hyperdense vessel.  Atherosclerotic calcifications. Skull: Normal. Negative for fracture or focal lesion. Sinuses/Orbits: Visualized orbits show no acute finding. No significant paranasal sinus disease at the imaged levels. IMPRESSION: No CT evidence of acute intracranial abnormality. Mild generalized parenchymal atrophy. Electronically Signed   By: Kellie Simmering DO   On: 07/01/2020 14:52   CT ANGIO NECK W OR WO CONTRAST  Result Date: 07/02/2020 CLINICAL DATA:  Carotid artery stenosis EXAM: CT ANGIOGRAPHY HEAD AND NECK TECHNIQUE: Multidetector CT imaging of the head and neck was performed using the standard protocol during bolus administration of intravenous contrast. Multiplanar CT image reconstructions and MIPs were obtained to evaluate the vascular anatomy. Carotid stenosis measurements (when applicable) are obtained utilizing NASCET criteria, using the distal internal carotid diameter as the denominator. CONTRAST:  49mL OMNIPAQUE IOHEXOL 350 MG/ML SOLN COMPARISON:  07/01/2020. 09/03/2013 outside CT neck report. 03/15/2014 outside CT chest report. FINDINGS: CT HEAD FINDINGS Brain: No acute infarct or intracranial hemorrhage. No mass lesion. No midline shift, ventriculomegaly or extra-axial fluid collection. Chronic left thalamic lacunar insult is better demonstrated on prior MRI. Vascular: No hyperdense vessel or  unexpected calcification. Bilateral skull base atherosclerotic calcifications. Skull: Negative for fracture or focal lesion. Sinuses/Orbits: Normal orbits. Clear paranasal  sinuses. No mastoid effusion. Other: None. Review of the MIP images confirms the above findings CTA NECK FINDINGS Aortic arch: Mild aortic arch atherosclerotic calcifications. Standard branching. Imaged portion shows no evidence of aneurysm or dissection. No significant stenosis of the major arch vessel origins. Right carotid system: Patent. Carotid bifurcation atherosclerotic calcifications. No evidence of dissection, stenosis (50% or greater) or occlusion. Left carotid system: Patent. Carotid bifurcation atherosclerotic calcifications. No evidence of dissection, stenosis (50% or greater) or occlusion. Vertebral arteries: Codominant. No evidence of dissection, stenosis (50% or greater) or occlusion. Skeleton: No acute finding. Post sternotomy sequela. Multilevel spondylosis. Sequela of ACDF. 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 Upper chest: No acute finding. Review of the MIP images confirms the above findings CTA HEAD FINDINGS Anterior circulation: Patent ICAs. Patent anterior cerebral arteries. Patent MCAs. No significant stenosis, proximal occlusion, aneurysm, or vascular malformation. Posterior circulation: Patent V4 segments and proximal PICA. Patent basilar artery. Patent right PCA. Patent left PCA. Superior cerebellar arteries are patent. No significant stenosis, proximal occlusion, aneurysm, or vascular malformation. Venous sinuses: As permitted by contrast timing, patent. Anatomic variants: None. Review of the MIP images confirms the above findings IMPRESSION: Head CT: No acute intracranial process. Head CTA: No large vessel occlusion, high-grade narrowing, dissection or aneurysm. Neck CTA: No large vessel occlusion,  high-grade narrowing, dissection or aneurysm. 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 Consider obtaining outside images for better assessment of interval change. Electronically Signed   By: Primitivo Gauze M.D.   On: 07/02/2020 10:00   MR ANGIO HEAD WO CONTRAST  Result Date: 07/01/2020 CLINICAL DATA:  Left eye vision loss. EXAM: MRA HEAD WITHOUT CONTRAST TECHNIQUE: Angiographic images of the Circle of Willis were obtained using MRA technique without intravenous contrast. COMPARISON:  None. FINDINGS: POSTERIOR CIRCULATION: --Vertebral arteries: Normal --Inferior cerebellar arteries: Normal. --Basilar artery: Normal. --Superior cerebellar arteries: Normal. --Posterior cerebral arteries: Normal. ANTERIOR CIRCULATION: --Intracranial internal carotid arteries: Symmetric signal loss of both internal carotid arteries at the skull base is favored to be artifactual. --Anterior cerebral arteries (ACA): Normal. --Middle cerebral arteries (MCA): Normal. ANATOMIC VARIANTS: None IMPRESSION: Symmetric abnormal appearance of the internal carotid arteries at the skull base is favored to be artifactual, though moderate-to-severe stenosis is also possible. CTA may better characterize this location. Otherwise normal intracranial MRA. Electronically Signed   By: Ulyses Jarred M.D.   On: 07/01/2020 23:24   MR BRAIN WO CONTRAST  Result Date: 07/01/2020 CLINICAL DATA:  Neuro deficit, acute, stroke suspected partial loss of vision since last week EXAM: MRI HEAD WITHOUT CONTRAST TECHNIQUE: Multiplanar, multiecho pulse sequences of the brain and surrounding structures were obtained without intravenous contrast. COMPARISON:  07/01/2020 FINDINGS: Brain: No diffusion-weighted signal abnormality. No intracranial hemorrhage. No midline shift, ventriculomegaly or extra-axial fluid collection. No mass lesion. Minimal chronic microvascular ischemic changes. Chronic left thalamic lacunar insult.  Vascular: Normal flow voids. Skull and upper cervical spine: Normal marrow signal. Sinuses/Orbits: Sequela of bilateral lens replacement. Pneumatized paranasal sinuses. Trace mastoid free fluid. Other: None. IMPRESSION: No acute intracranial process. Chronic left thalamic lacunar insult. Minimal chronic microvascular ischemic changes. Electronically Signed   By: Primitivo Gauze M.D.   On: 07/01/2020 19:36   US Carotid Bilateral (at [facility] and [facility] only)  Result Date: 07/02/2020 CLINICAL DATA:  86 year old female with partial left vision loss. EXAM: BILATERAL CAROTID DUPLEX ULTRASOUND TECHNIQUE: Pearline Cables scale imaging,  color Doppler and duplex ultrasound were performed of bilateral carotid and vertebral arteries in the neck. COMPARISON:  None. FINDINGS: Criteria: Quantification of carotid stenosis is based on velocity parameters that correlate the residual internal carotid diameter with NASCET-based stenosis levels, using the diameter of the distal internal carotid lumen as the denominator for stenosis measurement. The following velocity measurements were obtained: RIGHT ICA: Peak systolic velocity 59 cm/sec, End diastolic velocity 11 cm/sec CCA: Peak systolic velocity 68 cm/sec SYSTOLIC ICA/CCA RATIO:  1.0 ECA: Peak systolic velocity 77 cm/sec LEFT ICA: Peak systolic velocity 77 cm/sec, End diastolic velocity 16 cm/sec CCA: 71 cm/sec SYSTOLIC ICA/CCA RATIO:  1.5 ECA: 63 cm/sec RIGHT CAROTID ARTERY: Mild multifocal atherosclerotic plaque formation at the carotid bulb and bifurcation. No significant tortuosity. Normal low resistance waveforms. RIGHT VERTEBRAL ARTERY:  Antegrade flow. LEFT CAROTID ARTERY: Mild multifocal atherosclerotic plaque formation at the carotid bulb and bifurcation. No significant tortuosity. Normal low resistance waveforms. LEFT VERTEBRAL ARTERY:  Antegrade flow. Upper extremity non-invasive blood pressures: Not obtained. IMPRESSION: 1. Right carotid artery system: Less than 50% stenosis  secondary to mild multifocal atherosclerotic plaque formation. 2. Left carotid artery system: Less than 50% stenosis secondary to mild multifocal atherosclerotic plaque formation. 3.  Vertebral artery system: Patent with antegrade flow bilaterally. Ruthann Cancer, MD [facility] Electronically Signed   By: Ruthann Cancer MD   On: 07/02/2020 08:02   ASSESSMENT AND PLAN:  Jennifer Guerra is a 86 y.o. female past history of CHF, mitral valve repair on Coumadin, COPD, colon cancer, major depression, peripheral vascular disease comes in for evaluation of vision loss in the left eye.  She reports last known well about a week ago when she noted that she could not see anything past a certain point in the lower visual fields only from the left eye.  She went to her ophthalmologist who was concerned for a BRAO and sent for stroke work-up.  # L lower visual field loss in setting of Mitral valve repair and subther INR -Ruled out CVA -MRI brain neg for CVA -CTA head and neck neg for CA stenosis -appreciate neurology recommendation. Patient will need to follow-up ophthalmology as outpatient for further management. -Physical therapy recommends home health PT -continue warfarin-- to maintain INR between 2.5 and 3.5. -- Coumadin dose to be managed by pharmacy.   #. History of  COPD -- stable   # HTN -- resume losartan and chlorhtalidone  #HLD -- continue statins  # GERD -- continue Protonix      Procedures: Family communication :none-- patient tells me her family is updated Consults : neurology CODE STATUS: full DVT Prophylaxis : Coumadin  Status is: Inpatient  Remains inpatient appropriate because:Inpatient level of care appropriate due to severity of illness   Dispo: The patient is from: SNF              Anticipated d/c is to: SNF              Anticipated d/c date is: 1 day              Patient currently is not medically stable to  d/c.  Patient came in with loss of vision. Her INR is marginally sub therapeutic. Will increase dose of Coumadin today in anticipation for INR to be therapeutic between 2.5 to 3.5. Echo pending. Likely discharge back to her facility tomorrow.      TOTAL TIME TAKING CARE OF THIS PATIENT: *25* minutes.  >50% time  spent on counselling and coordination of care  Note: This dictation was prepared with Dragon dictation along with smaller phrase technology. Any transcriptional errors that result from this process are unintentional.  Fritzi Mandes M.D    [facility]   CC: Primary care physician; Pcp, NoPatient ID: Jennifer Guerra, female   DOB: [birth year], 86 y.o.   MRN: [id number]

## 2020-07-02 NOTE — ED Notes (Signed)
Pt assisted to bathroom, 1 assist.  

## 2020-07-02 NOTE — ED Notes (Signed)
Pt resting quietly in hallway bed.

## 2020-07-02 NOTE — ED Notes (Signed)
Iv team here to place midline on pt.

## 2020-07-02 NOTE — ED Notes (Signed)
Pt repositioned in bed. Denies needs at this time

## 2020-07-02 NOTE — ED Notes (Signed)
Pt resting quietly at this time with eyes closed, respirations equal and unlabored.

## 2020-07-02 NOTE — Consult Note (Addendum)
[place] for Enoxaparin Indication: hx of MVR  Allergies  Allergen Reactions  . Phenergan [Promethazine] Anaphylaxis  . Codeine Nausea And Vomiting  . Penicillins Rash    Patient Measurements: Height: 5\' 2"  (157.5 cm) Weight: 95.3 kg (210 lb) IBW/kg (Calculated) : 50.1  Vital Signs: Temp: 98.2 F (36.8 C) (12/15 1000) Temp Source: Oral (12/15 1000) BP: 155/51 (12/15 0400) Pulse Rate: 68 (12/15 1000)  Labs: Recent Labs    07/01/20 1354 07/02/20 0553  HGB 12.5  --   HCT 38.3  --   PLT 226  --   APTT 39*  --   LABPROT 23.7* 22.6*  INR 2.2* 2.1*  CREATININE 0.80  --     Estimated Creatinine Clearance: 54.3 mL/min (by C-G formula based on SCr of 0.8 mg/dL).   Medical History: Past Medical History:  Diagnosis Date  . CHF (congestive heart failure) ([place])   . Colon cancer ([place])   . COPD (chronic obstructive pulmonary disease) ([place])   . Coronary artery disease   . GERD (gastroesophageal reflux disease)   . Hyperlipemia   . Hypertension   . Iron deficiency anemia   . Major depression   . Neuropathy   . Peripheral vascular disease ([place])   . Renal disorder   . Sleep apnea     Medications:  (Not in a hospital admission)  Scheduled:  .  stroke: mapping our early stages of recovery book   Does not apply Once  . aspirin  300 mg Rectal Daily   Or  . aspirin  325 mg Oral Daily  . Chlorhexidine Gluconate Cloth  6 each Topical Daily  . enoxaparin (LOVENOX) injection  1 mg/kg Subcutaneous Q12H  . sodium chloride flush  3 mL Intravenous Once   Infusions:  . sodium chloride 75 mL/hr at 07/02/20 0645   PRN:  Anti-infectives (From admission, onward)   None      Assessment: Pharmacy consulted to start enoxaparin with a hx of MVR. Concerns for possible branch retinal artery occulusion. Med rec not reviewed. Home warfarin dose was 5mg  nightly per patient. INR 2.2 on admit. CBC stable.   Date INR Dose comment 12/13 -- 5mg    PTA 12/14 2.2 -- LMWH bridge 12/15 2.1 6mg  Increase dose tonight, & d/c LMWH.   Goal of Therapy:  MVR w/ goal INR 2.5-3.5 Monitor platelets by anticoagulation protocol: Yes   Plan:  ADDENDUM (1730 12/15) Restart with 6mg  warfarin dose this evening to compensate missed 12/14 dose and follow with morning INR.  Will stop bridging with therapeutic lovenox per discussion with Dr. Posey Pronto and assess with AM INR.  Lorna Dibble, PharmD, BCPS 07/02/2020,11:01 AM

## 2020-07-02 NOTE — Progress Notes (Signed)
SLP Cancellation Note  Patient Details Name: Jennifer Guerra MRN: [id number] DOB: 27-Sep-1931   Cancelled treatment:       Reason Eval/Treat Not Completed: SLP screened, no needs identified, will sign off (chart reviewed; consulted NSG and pt w/ PT) Pt denied any difficulty swallowing and is currently on a regular diet; pills being given w/ water per NSG. Pt conversed at conversational level w/out deficits noted; pt followed instructions w/ PT during session. Pt denied any new speech-language issues; stated previous "hesitation w/ some difficult words to pronounce since I was a child".  No further skilled ST services indicated as pt appears at her baseline. Pt agreed. NSG to reconsult if any change in status while admitted.     Orinda Kenner, MS, CCC-SLP Speech Language Pathologist [facility] [phone] [facility] 07/02/2020, 8:59 AM

## 2020-07-02 NOTE — ED Notes (Signed)
Pt assisted to restroom with tracey NT

## 2020-07-02 NOTE — ED Notes (Signed)
Patient ambulated to and from hallway bathroom with walker. Patient was given ED briefs and pads in place of diaper. Yellow socks were placed on patient for safety.

## 2020-07-02 NOTE — Evaluation (Signed)
Physical Therapy Evaluation Patient Details Name: Malcolm Hetz MRN: [id number] DOB: [birth year] Today's Date: 07/02/2020   History of Present Illness  86 y.o. female with a known history of CHF, COPD, CAD, HTN, HLD, GERD, IDA, depression, PVD, OSA, MVR on Coumadin (goal INR 2.5-3.5, today 2.2).  presents to the emergency department for evaluation of vision loss.  Patient was in a usual state of health until one week ago she noticed painless left lower vision field loss.  Sent to ED by eye doctor, still having L eye lower field blurry  Clinical Impression  Pt did well with mobility and overall showed safety with most aspects of session.  She had some fatigue with ambulation and we discussed RW vs 4WW and overall.  She continues to have the L lower quadrant blurred vision, but otherwise is close to her baseline.  She did fatigue more than she expected with the walk and showed some hesitancy due to vision, but no LOBs or overt safety issues.  Good overall effort, will benefit from continued PT aonce she returns to her SNF.   Follow Up Recommendations Home health PT    Equipment Recommendations  Rolling walker with 5" wheels    Recommendations for Other Services       Precautions / Restrictions Precautions Precautions: Fall Restrictions Weight Bearing Restrictions: No      Mobility  Bed Mobility Overal bed mobility: Independent             General bed mobility comments: Pt able to get herself up to sitting w/o assist or excessive UE reliance    Transfers Overall transfer level: Modified independent Equipment used: Rolling walker (2 wheeled)             General transfer comment: Pt able to rise with confidence, able to maintain static balance w/o reliance on walker on standing  Ambulation/Gait Ambulation/Gait assistance: Supervision Gait Distance (Feet): 150 Feet Assistive device: Rolling walker (2 wheeled)       General Gait Details: Pt was able to manage prolonged  bout of slow but steady ambulation w/o issue.  Her O2 remained in the 90s and HR was also stable (generally in the 80s).  She did need the walker but was not overly reliant on it, no LOBs or safety issues.  Stairs            Wheelchair Mobility    Modified Rankin (Stroke Patients Only)       Balance Overall balance assessment: Modified Independent                                           Pertinent Vitals/Pain Pain Assessment: No/denies pain    Home Living Family/patient expects to be discharged to:: Skilled nursing facility                      Prior Function Level of Independence: Independent with assistive device(s)         Comments: Pt reports that she walks around ad lib with 4WW at her facility.  At least one meal/day in dining area and participates in many of the provided activities.     Hand Dominance        Extremity/Trunk Assessment   Upper Extremity Assessment Upper Extremity Assessment: Overall WFL for tasks assessed;Generalized weakness (lacks b/l shld elevation >90, euqal strength and quality of motion bilaterally)  Lower Extremity Assessment Lower Extremity Assessment: Overall WFL for tasks assessed;Generalized weakness       Communication   Communication: No difficulties  Cognition Arousal/Alertness: Awake/alert Behavior During Therapy: WFL for tasks assessed/performed Overall Cognitive Status: Within Functional Limits for tasks assessed                                        General Comments      Exercises     Assessment/Plan    PT Assessment Patient needs continued PT services  PT Problem List Decreased strength;Decreased activity tolerance;Decreased balance;Decreased range of motion;Decreased mobility;Decreased knowledge of use of DME;Decreased safety awareness       PT Treatment Interventions Gait training;DME instruction;Functional mobility training;Therapeutic  activities;Therapeutic exercise;Balance training;Neuromuscular re-education;Patient/family education    PT Goals (Current goals can be found in the Care Plan section)  Acute Rehab PT Goals Patient Stated Goal: go back to her SNF PT Goal Formulation: With patient Time For Goal Achievement: 07/16/20 Potential to Achieve Goals: Good    Frequency Min 2X/week   Barriers to discharge        Co-evaluation               AM-PAC PT "6 Clicks" Mobility  Outcome Measure Help needed turning from your back to your side while in a flat bed without using bedrails?: None Help needed moving from lying on your back to sitting on the side of a flat bed without using bedrails?: None Help needed moving to and from a bed to a chair (including a wheelchair)?: A Little Help needed standing up from a chair using your arms (e.g., wheelchair or bedside chair)?: A Little Help needed to walk in hospital room?: A Little Help needed climbing 3-5 steps with a railing? : A Little 6 Click Score: 20    End of Session Equipment Utilized During Treatment: Gait belt Activity Tolerance: Patient tolerated treatment well Patient left: in bed;with call bell/phone within reach Nurse Communication: Mobility status PT Visit Diagnosis: Muscle weakness (generalized) (M62.81);Difficulty in walking, not elsewhere classified (R26.2)    Time: 2863-8177 PT Time Calculation (min) (ACUTE ONLY): 24 min   Charges:   PT Evaluation $PT Eval Low Complexity: 1 Low PT Treatments $Gait Training: 8-22 mins        Kreg Shropshire, DPT 07/02/2020, 1:34 PM

## 2020-07-02 NOTE — Progress Notes (Signed)
Pt arrive to unit A&Ox4, no signs of distress. Ambulates w/walker to BR. Able to communicate wants and needs.

## 2020-07-03 DIAGNOSIS — H5462 Unqualified visual loss, left eye, normal vision right eye: Principal | ICD-10-CM

## 2020-07-03 DIAGNOSIS — R791 Abnormal coagulation profile: Secondary | ICD-10-CM

## 2020-07-03 DIAGNOSIS — Z952 Presence of prosthetic heart valve: Secondary | ICD-10-CM

## 2020-07-03 LAB — ECHOCARDIOGRAM COMPLETE
AR max vel: 1.6 cm2
AV Area VTI: 1.8 cm2
AV Area mean vel: 1.81 cm2
AV Mean grad: 9.3 mmHg
AV Peak grad: 15.2 mmHg
Ao pk vel: 1.95 m/s
Area-P 1/2: 2.45 cm2
Height: 62 in
S' Lateral: 3.07 cm
Weight: 3360 oz

## 2020-07-03 LAB — PROTIME-INR
INR: 1.4 — ABNORMAL HIGH (ref 0.8–1.2)
Prothrombin Time: 16.5 seconds — ABNORMAL HIGH (ref 11.4–15.2)

## 2020-07-03 MED ORDER — ENOXAPARIN SODIUM 100 MG/ML ~~LOC~~ SOLN
1.0000 mg/kg | Freq: Two times a day (BID) | SUBCUTANEOUS | Status: DC
  Administered 2020-07-03 – 2020-07-05 (×5): 95 mg via SUBCUTANEOUS
  Filled 2020-07-03 (×7): qty 1

## 2020-07-03 MED ORDER — WARFARIN SODIUM 4 MG PO TABS
8.0000 mg | ORAL_TABLET | Freq: Once | ORAL | Status: AC
  Administered 2020-07-03: 17:00:00 8 mg via ORAL
  Filled 2020-07-03: qty 2

## 2020-07-03 NOTE — Plan of Care (Signed)
  Problem: Education: Goal: Knowledge of patient specific risk factors addressed and post discharge goals established will improve 07/03/2020 1855 by Cristela Blue, RN Outcome: Progressing 07/03/2020 1855 by Cristela Blue, RN Outcome: Progressing Goal: Individualized Educational Video(s) 07/03/2020 1855 by Cristela Blue, RN Outcome: Progressing 07/03/2020 1855 by Cristela Blue, RN Outcome: Progressing

## 2020-07-03 NOTE — Consult Note (Addendum)
[facility] for Enoxaparin Indication: hx of MVR  Allergies  Allergen Reactions  . Phenergan [Promethazine] Anaphylaxis  . Codeine Nausea And Vomiting  . Penicillins Rash    Patient Measurements: Height: 5\' 2"  (157.5 cm) Weight: 95.3 kg (210 lb) IBW/kg (Calculated) : 50.1  Vital Signs: Temp: 98.7 F (37.1 C) (12/16 0517) Temp Source: Oral (12/16 0517) BP: 137/49 (12/16 0517) Pulse Rate: 67 (12/16 0517)  Labs: Recent Labs    07/01/20 1354 07/02/20 0553 07/03/20 0551  HGB 12.5  --   --   HCT 38.3  --   --   PLT 226  --   --   APTT 39*  --   --   LABPROT 23.7* 22.6* 16.5*  INR 2.2* 2.1* 1.4*  CREATININE 0.80  --   --     Estimated Creatinine Clearance: 54.3 mL/min (by C-G formula based on SCr of 0.8 mg/dL).   Medical History: Past Medical History:  Diagnosis Date  . CHF (congestive heart failure) ([place])   . Colon cancer ([place])   . COPD (chronic obstructive pulmonary disease) ([place])   . Coronary artery disease   . GERD (gastroesophageal reflux disease)   . Hyperlipemia   . Hypertension   . Iron deficiency anemia   . Major depression   . Neuropathy   . Peripheral vascular disease ([place])   . Renal disorder   . Sleep apnea     Medications:  Medications Prior to Admission  Medication Sig Dispense Refill Last Dose  . acetaminophen (TYLENOL) 500 MG tablet Take 1,000 mg by mouth every 8 (eight) hours.   07/01/2020 at Unknown time  . albuterol (VENTOLIN HFA) 108 (90 Base) MCG/ACT inhaler Inhale 2 puffs into the lungs every 4 (four) hours as needed for wheezing or shortness of breath.   prn at prn  . albuterol (VENTOLIN HFA) 108 (90 Base) MCG/ACT inhaler Inhale 1 puff into the lungs 2 (two) times daily.   07/01/2020 at Unknown time  . ASPERCREME LIDOCAINE 4 % PTCH Apply 1 patch topically daily.   Past Week at Unknown time  . atorvastatin (LIPITOR) 20 MG tablet Take 20 mg by mouth at bedtime.   Past Week at Unknown time  . Baclofen  5 MG TABS Take 5 mg by mouth 2 (two) times daily.   07/01/2020 at Unknown time  . busPIRone (BUSPAR) 10 MG tablet Take 10 mg by mouth 3 (three) times daily.   07/01/2020 at Unknown time  . cetirizine (ZYRTEC) 10 MG tablet Take 10 mg by mouth daily as needed for allergies.   prn at prn  . chlorthalidone (HYGROTON) 25 MG tablet Take 25 mg by mouth daily.   07/01/2020 at Unknown time  . Cholecalciferol 50 MCG (2000 UT) TABS Take 2,000 Units by mouth daily.   07/01/2020 at Unknown time  . Dextran 70-Hypromellose, PF, 0.1-0.3 % SOLN Place 1 drop into both eyes 2 (two) times daily.   07/01/2020 at Unknown time  . diclofenac Sodium (VOLTAREN) 1 % GEL Apply 2 g topically at bedtime. Apply to neck, right hip and left hand   Past Week at Unknown time  . gabapentin (NEURONTIN) 300 MG capsule Take 300 mg by mouth 2 (two) times daily.   07/01/2020 at Unknown time  . gabapentin (NEURONTIN) 400 MG capsule Take 400 mg by mouth at bedtime.   Past Week at Unknown time  . guaiFENesin (ROBITUSSIN) 100 MG/5ML SOLN Take 5 mLs by mouth at bedtime as needed  for cough or to loosen phlegm.   prn at prn  . losartan (COZAAR) 50 MG tablet Take 50 mg by mouth daily.   07/01/2020 at Unknown time  . melatonin 3 MG TABS tablet Take 9 mg by mouth at bedtime.   Past Week at Unknown time  . Omega-3 1000 MG CAPS Take 2 capsules by mouth daily.   07/01/2020 at Unknown time  . omeprazole (PRILOSEC) 20 MG capsule Take 20 mg by mouth daily.   07/01/2020 at Unknown time  . oxyCODONE (OXY IR/ROXICODONE) 5 MG immediate release tablet Take 5 mg by mouth every 6 (six) hours as needed for severe pain.   prn at prn  . oxyCODONE (OXY IR/ROXICODONE) 5 MG immediate release tablet Take 5 mg by mouth as directed. Take 5 mg once an evening, give 5 mg at bedtime   07/01/2020 at Unknown time  . polyethylene glycol (MIRALAX / GLYCOLAX) 17 g packet Take 17 g by mouth daily.   Past Week at Unknown time  . senna (SENOKOT) 8.6 MG TABS tablet Take 2 tablets by  mouth at bedtime.   Past Week at Unknown time  . sertraline (ZOLOFT) 100 MG tablet Take 200 mg by mouth daily.   07/01/2020 at Unknown time  . sodium chloride (OCEAN) 0.65 % SOLN nasal spray Place 1 spray into both nostrils every 12 (twelve) hours.   07/01/2020 at Unknown time  . warfarin (COUMADIN) 5 MG tablet Take 5 mg by mouth every evening.   06/30/2020 at 1749  . Wheat Dextrin (BENEFIBER DRINK MIX PO) Take 1 Scoop by mouth at bedtime.   Past Week at Unknown time   Scheduled:  .  stroke: mapping our early stages of recovery book   Does not apply Once  . atorvastatin  20 mg Oral QHS  . baclofen  5 mg Oral BID  . busPIRone  10 mg Oral TID  . Chlorhexidine Gluconate Cloth  6 each Topical Daily  . chlorthalidone  25 mg Oral Daily  . cholecalciferol  2,000 Units Oral Daily  . gabapentin  300 mg Oral BID  . gabapentin  400 mg Oral QHS  . loratadine  10 mg Oral Daily  . losartan  50 mg Oral Daily  . melatonin  10 mg Oral QHS  . omega-3 acid ethyl esters  2 g Oral Daily  . pantoprazole  40 mg Oral Daily  . polyethylene glycol  17 g Oral Daily  . sertraline  200 mg Oral Daily  . sodium chloride flush  3 mL Intravenous Once  . warfarin  6 mg Oral ONCE-1600  . Warfarin - Pharmacist Dosing Inpatient   Does not apply q1600   Infusions:   PRN:  Anti-infectives (From admission, onward)   None      Assessment: Pharmacy consulted to start enoxaparin with a hx of MVR. Concerns for possible branch retinal artery occulusion. Med rec not reviewed. Home warfarin dose was 5mg  nightly per patient. INR 2.2 on admit. CBC stable.   Date INR Dose comment 12/13 -- 5mg   PTA 12/14 2.2 -- LMWH bridge 12/15 2.1 6mg      Warfarin 6 mg was ordered for 12/16 instead of 12/15. Therefore, patient did not receive dose.  12/16   1.4       -----      Today's INR is subtherapeutic - likely the effects of the missed dose on 11/14 and 11/15.   Goal of Therapy:  MVR w/ goal INR 2.5-3.5 Monitor platelets  by  anticoagulation protocol: Yes   Plan:   Will order warfarin 8 mg (60% dose increase) x1 dose for 1600. Will check INR/CBC tomorrow morning.   Will follow-up with Dr. Fritzi Mandes to determine whether Lovenox bridge is indicated at this time. Addendum @ [phone]: Per D/w Dr. Fritzi Mandes, will re-start Lovenox bridge. Will order Lovenox 1mg /kg Q12H.  Rowland Lathe, PharmD 07/03/2020,7:29 AM

## 2020-07-03 NOTE — Progress Notes (Signed)
[place] [facility] [place] NAME: Jennifer Guerra    MR#:  [id number]  DATE OF BIRTH:  1931/07/26  SUBJECTIVE:  Pt came in with left lower quadrant vision loss for few days. She is feeling about the same. No worsening or improvement in her vision.  No other focal deficit. No family in the room.  REVIEW OF SYSTEMS:   Review of Systems  Constitutional: Negative for chills, fever and weight loss.  HENT: Negative for ear discharge, ear pain and nosebleeds.   Eyes: Positive for blurred vision. Negative for pain and discharge.  Respiratory: Negative for sputum production, shortness of breath, wheezing and stridor.   Cardiovascular: Negative for chest pain, palpitations, orthopnea and PND.  Gastrointestinal: Negative for abdominal pain, diarrhea, nausea and vomiting.  Genitourinary: Negative for frequency and urgency.  Musculoskeletal: Negative for back pain and joint pain.  Neurological: Negative for sensory change, speech change, focal weakness and weakness.  Psychiatric/Behavioral: Negative for depression and hallucinations. The patient is not nervous/anxious.    Tolerating Diet: yes Tolerating PT: HHPT  DRUG ALLERGIES:   Allergies  Allergen Reactions  . Phenergan [Promethazine] Anaphylaxis  . Codeine Nausea And Vomiting  . Penicillins Rash    VITALS:  Blood pressure (!) 148/54, pulse (!) 57, temperature 98.6 F (37 C), resp. rate 16, height 5\' 2"  (1.575 m), weight 95.3 kg, SpO2 97 %.  PHYSICAL EXAMINATION:   Physical Exam  GENERAL:  86 y.o.-year-old patient lying in the bed with no acute distress.  Left Lower quadrant vision loss LUNGS: Normal breath sounds bilaterally, no wheezing, rales, rhonchi. No use of accessory muscles of respiration.  CARDIOVASCULAR: S1, S2 normal. No murmurs, rubs, or gallops.  ABDOMEN: Soft, nontender, nondistended. Bowel sounds present. No organomegaly or mass.  EXTREMITIES: No cyanosis, clubbing or edema  b/l.    NEUROLOGIC: Cranial nerves II through XII are intact. No focal Motor or sensory deficits b/l.   PSYCHIATRIC:  patient is alert and oriented x 3.  SKIN: No obvious rash, lesion, or ulcer.   LABORATORY PANEL:  CBC Recent Labs  Lab 07/01/20 1354  WBC 6.5  HGB 12.5  HCT 38.3  PLT 226    Chemistries  Recent Labs  Lab 07/01/20 1354  NA 140  K 4.0  CL 102  CO2 28  GLUCOSE 135*  BUN 15  CREATININE 0.80  CALCIUM 9.8  AST 30  ALT 22  ALKPHOS 42  BILITOT 1.0   Cardiac Enzymes No results for input(s): TROPONINI in the last 168 hours. RADIOLOGY:  CT ANGIO HEAD W OR WO CONTRAST  Result Date: 07/02/2020 CLINICAL DATA:  Carotid artery stenosis EXAM: CT ANGIOGRAPHY HEAD AND NECK TECHNIQUE: Multidetector CT imaging of the head and neck was performed using the standard protocol during bolus administration of intravenous contrast. Multiplanar CT image reconstructions and MIPs were obtained to evaluate the vascular anatomy. Carotid stenosis measurements (when applicable) are obtained utilizing NASCET criteria, using the distal internal carotid diameter as the denominator. CONTRAST:  66mL OMNIPAQUE IOHEXOL 350 MG/ML SOLN COMPARISON:  07/01/2020. 09/03/2013 outside CT neck report. 03/15/2014 outside CT chest report. FINDINGS: CT HEAD FINDINGS Brain: No acute infarct or intracranial hemorrhage. No mass lesion. No midline shift, ventriculomegaly or extra-axial fluid collection. Chronic left thalamic lacunar insult is better demonstrated on prior MRI. Vascular: No hyperdense vessel or unexpected calcification. Bilateral skull base atherosclerotic calcifications. Skull: Negative for fracture or focal lesion. Sinuses/Orbits: Normal orbits. Clear paranasal sinuses. No mastoid effusion. Other: None.  Review of the MIP images confirms the above findings CTA NECK FINDINGS Aortic arch: Mild aortic arch atherosclerotic calcifications. Standard branching. Imaged portion shows no evidence of aneurysm or  dissection. No significant stenosis of the major arch vessel origins. Right carotid system: Patent. Carotid bifurcation atherosclerotic calcifications. No evidence of dissection, stenosis (50% or greater) or occlusion. Left carotid system: Patent. Carotid bifurcation atherosclerotic calcifications. No evidence of dissection, stenosis (50% or greater) or occlusion. Vertebral arteries: Codominant. No evidence of dissection, stenosis (50% or greater) or occlusion. Skeleton: No acute finding. Post sternotomy sequela. Multilevel spondylosis. Sequela of ACDF. 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 Upper chest: No acute finding. Review of the MIP images confirms the above findings CTA HEAD FINDINGS Anterior circulation: Patent ICAs. Patent anterior cerebral arteries. Patent MCAs. No significant stenosis, proximal occlusion, aneurysm, or vascular malformation. Posterior circulation: Patent V4 segments and proximal PICA. Patent basilar artery. Patent right PCA. Patent left PCA. Superior cerebellar arteries are patent. No significant stenosis, proximal occlusion, aneurysm, or vascular malformation. Venous sinuses: As permitted by contrast timing, patent. Anatomic variants: None. Review of the MIP images confirms the above findings IMPRESSION: Head CT: No acute intracranial process. Head CTA: No large vessel occlusion, high-grade narrowing, dissection or aneurysm. Neck CTA: No large vessel occlusion, high-grade narrowing, dissection or aneurysm. 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 Consider obtaining outside images for better assessment of interval change. Electronically Signed   By: Primitivo Gauze M.D.   On: 07/02/2020 10:00   CT HEAD WO CONTRAST  Result Date: 07/01/2020 CLINICAL DATA:  Neuro deficit, acute, stroke suspected. Additional history provided: Partial  vision loss left eye (symptoms since last week). EXAM: CT HEAD WITHOUT CONTRAST TECHNIQUE: Contiguous axial images were obtained from the base of the skull through the vertex without intravenous contrast. COMPARISON:  No pertinent prior exams available for comparison. FINDINGS: Brain: Mild cerebral and cerebellar atrophy. There is no acute intracranial hemorrhage. No demarcated cortical infarct. No extra-axial fluid collection. No evidence of intracranial mass. No midline shift. Vascular: No hyperdense vessel.  Atherosclerotic calcifications. Skull: Normal. Negative for fracture or focal lesion. Sinuses/Orbits: Visualized orbits show no acute finding. No significant paranasal sinus disease at the imaged levels. IMPRESSION: No CT evidence of acute intracranial abnormality. Mild generalized parenchymal atrophy. Electronically Signed   By: Kellie Simmering DO   On: 07/01/2020 14:52   CT ANGIO NECK W OR WO CONTRAST  Result Date: 07/02/2020 CLINICAL DATA:  Carotid artery stenosis EXAM: CT ANGIOGRAPHY HEAD AND NECK TECHNIQUE: Multidetector CT imaging of the head and neck was performed using the standard protocol during bolus administration of intravenous contrast. Multiplanar CT image reconstructions and MIPs were obtained to evaluate the vascular anatomy. Carotid stenosis measurements (when applicable) are obtained utilizing NASCET criteria, using the distal internal carotid diameter as the denominator. CONTRAST:  61mL OMNIPAQUE IOHEXOL 350 MG/ML SOLN COMPARISON:  07/01/2020. 09/03/2013 outside CT neck report. 03/15/2014 outside CT chest report. FINDINGS: CT HEAD FINDINGS Brain: No acute infarct or intracranial hemorrhage. No mass lesion. No midline shift, ventriculomegaly or extra-axial fluid collection. Chronic left thalamic lacunar insult is better demonstrated on prior MRI. Vascular: No hyperdense vessel or unexpected calcification. Bilateral skull base atherosclerotic calcifications. Skull: Negative for fracture or  focal lesion. Sinuses/Orbits: Normal orbits. Clear paranasal sinuses. No mastoid effusion. Other: None. Review of the MIP images confirms the above findings CTA  NECK FINDINGS Aortic arch: Mild aortic arch atherosclerotic calcifications. Standard branching. Imaged portion shows no evidence of aneurysm or dissection. No significant stenosis of the major arch vessel origins. Right carotid system: Patent. Carotid bifurcation atherosclerotic calcifications. No evidence of dissection, stenosis (50% or greater) or occlusion. Left carotid system: Patent. Carotid bifurcation atherosclerotic calcifications. No evidence of dissection, stenosis (50% or greater) or occlusion. Vertebral arteries: Codominant. No evidence of dissection, stenosis (50% or greater) or occlusion. Skeleton: No acute finding. Post sternotomy sequela. Multilevel spondylosis. Sequela of ACDF. 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 Upper chest: No acute finding. Review of the MIP images confirms the above findings CTA HEAD FINDINGS Anterior circulation: Patent ICAs. Patent anterior cerebral arteries. Patent MCAs. No significant stenosis, proximal occlusion, aneurysm, or vascular malformation. Posterior circulation: Patent V4 segments and proximal PICA. Patent basilar artery. Patent right PCA. Patent left PCA. Superior cerebellar arteries are patent. No significant stenosis, proximal occlusion, aneurysm, or vascular malformation. Venous sinuses: As permitted by contrast timing, patent. Anatomic variants: None. Review of the MIP images confirms the above findings IMPRESSION: Head CT: No acute intracranial process. Head CTA: No large vessel occlusion, high-grade narrowing, dissection or aneurysm. Neck CTA: No large vessel occlusion, high-grade narrowing, dissection or aneurysm. 3.9 cm enhancing right lower paratracheal mass has been present since  2010. Consider obtaining outside images for better assessment of interval change. Electronically Signed   By: Primitivo Gauze M.D.   On: 07/02/2020 10:00   MR ANGIO HEAD WO CONTRAST  Result Date: 07/01/2020 CLINICAL DATA:  Left eye vision loss. EXAM: MRA HEAD WITHOUT CONTRAST TECHNIQUE: Angiographic images of the Circle of Willis were obtained using MRA technique without intravenous contrast. COMPARISON:  None. FINDINGS: POSTERIOR CIRCULATION: --Vertebral arteries: Normal --Inferior cerebellar arteries: Normal. --Basilar artery: Normal. --Superior cerebellar arteries: Normal. --Posterior cerebral arteries: Normal. ANTERIOR CIRCULATION: --Intracranial internal carotid arteries: Symmetric signal loss of both internal carotid arteries at the skull base is favored to be artifactual. --Anterior cerebral arteries (ACA): Normal. --Middle cerebral arteries (MCA): Normal. ANATOMIC VARIANTS: None IMPRESSION: Symmetric abnormal appearance of the internal carotid arteries at the skull base is favored to be artifactual, though moderate-to-severe stenosis is also possible. CTA may better characterize this location. Otherwise normal intracranial MRA. Electronically Signed   By: Ulyses Jarred M.D.   On: 07/01/2020 23:24   MR BRAIN WO CONTRAST  Result Date: 07/01/2020 CLINICAL DATA:  Neuro deficit, acute, stroke suspected partial loss of vision since last week EXAM: MRI HEAD WITHOUT CONTRAST TECHNIQUE: Multiplanar, multiecho pulse sequences of the brain and surrounding structures were obtained without intravenous contrast. COMPARISON:  07/01/2020 FINDINGS: Brain: No diffusion-weighted signal abnormality. No intracranial hemorrhage. No midline shift, ventriculomegaly or extra-axial fluid collection. No mass lesion. Minimal chronic microvascular ischemic changes. Chronic left thalamic lacunar insult. Vascular: Normal flow voids. Skull and upper cervical spine: Normal marrow signal. Sinuses/Orbits: Sequela of bilateral  lens replacement. Pneumatized paranasal sinuses. Trace mastoid free fluid. Other: None. IMPRESSION: No acute intracranial process. Chronic left thalamic lacunar insult. Minimal chronic microvascular ischemic changes. Electronically Signed   By: Primitivo Gauze M.D.   On: 07/01/2020 19:36   US Carotid Bilateral (at [facility] and [facility] only)  Result Date: 07/02/2020 CLINICAL DATA:  86 year old female with partial left vision loss. EXAM: BILATERAL CAROTID DUPLEX ULTRASOUND TECHNIQUE: Pearline Cables scale imaging, color Doppler and duplex ultrasound were performed of bilateral carotid and vertebral arteries in the neck.  COMPARISON:  None. FINDINGS: Criteria: Quantification of carotid stenosis is based on velocity parameters that correlate the residual internal carotid diameter with NASCET-based stenosis levels, using the diameter of the distal internal carotid lumen as the denominator for stenosis measurement. The following velocity measurements were obtained: RIGHT ICA: Peak systolic velocity 59 cm/sec, End diastolic velocity 11 cm/sec CCA: Peak systolic velocity 68 cm/sec SYSTOLIC ICA/CCA RATIO:  1.0 ECA: Peak systolic velocity 77 cm/sec LEFT ICA: Peak systolic velocity 77 cm/sec, End diastolic velocity 16 cm/sec CCA: 71 cm/sec SYSTOLIC ICA/CCA RATIO:  1.5 ECA: 63 cm/sec RIGHT CAROTID ARTERY: Mild multifocal atherosclerotic plaque formation at the carotid bulb and bifurcation. No significant tortuosity. Normal low resistance waveforms. RIGHT VERTEBRAL ARTERY:  Antegrade flow. LEFT CAROTID ARTERY: Mild multifocal atherosclerotic plaque formation at the carotid bulb and bifurcation. No significant tortuosity. Normal low resistance waveforms. LEFT VERTEBRAL ARTERY:  Antegrade flow. Upper extremity non-invasive blood pressures: Not obtained. IMPRESSION: 1. Right carotid artery system: Less than 50% stenosis secondary to mild multifocal atherosclerotic plaque formation. 2. Left carotid artery system: Less than 50% stenosis  secondary to mild multifocal atherosclerotic plaque formation. 3.  Vertebral artery system: Patent with antegrade flow bilaterally. Ruthann Cancer, MD [facility] Electronically Signed   By: Ruthann Cancer MD   On: 07/02/2020 08:02   ASSESSMENT AND PLAN:  Jennifer Guerra is a 86 y.o. female past history of CHF, mitral valve repair on Coumadin, COPD, colon cancer, major depression, peripheral vascular disease comes in for evaluation of vision loss in the left eye.  She reports last known well about a week ago when she noted that she could not see anything past a certain point in the lower visual fields only from the left eye.  She went to her ophthalmologist who was concerned for a BRAO and sent for stroke work-up.  # L lower visual field loss in setting of Mitral valve replacement  and subther INR -Ruled out CVA -MRI brain neg for CVA -CTA head and neck neg for CA stenosis -appreciate neurology recommendation. Patient will need to follow-up ophthalmology as outpatient for further management. -Physical therapy recommends home health PT -continue warfarin-- to maintain INR between 2.5 and 3.5. -- Coumadin dose to be managed by pharmacy. --12/16-- apparently patient did not get her Coumadin dose yesterday due to error in ordering per pharmacy. INR down to 1.4 -- will place patient on Lovenox bridge and give Coumadin 8 mg tonight.  #. History of  COPD -- stable   # HTN -- resume losartan and chlorhtalidone  #HLD -- continue statins  # GERD -- continue Protonix      Procedures: Family communication : discussed with daughter Neoma Laming on the phone.  consults : neurology CODE STATUS: full DVT Prophylaxis : Coumadin  Status is: Inpatient  Remains inpatient appropriate because:Inpatient level of care appropriate due to severity of illness   Dispo: The patient is from: SNF              Anticipated d/c is to: SNF               Anticipated d/c date is: 1 day              Patient currently is not medically stable to d/c.  Patient came in with loss of vision. Her INR is marginally sub therapeutic. Will increase dose of Coumadin today in anticipation for INR to be therapeutic between 2.5 to 3.5. Echo pending. Likely discharge back to her  facility tomorrow.      TOTAL TIME TAKING CARE OF THIS PATIENT: *25* minutes.  >50% time spent on counselling and coordination of care  Note: This dictation was prepared with Dragon dictation along with smaller phrase technology. Any transcriptional errors that result from this process are unintentional.  Fritzi Mandes M.D    [facility]   CC: Primary care physician; Pcp, NoPatient ID: Jennifer Guerra, female   DOB: 04-16-1932, 86 y.o.   MRN: [id number]

## 2020-07-04 LAB — CBC
HCT: 35.6 % — ABNORMAL LOW (ref 36.0–46.0)
Hemoglobin: 12.2 g/dL (ref 12.0–15.0)
MCH: 31.7 pg (ref 26.0–34.0)
MCHC: 34.3 g/dL (ref 30.0–36.0)
MCV: 92.5 fL (ref 80.0–100.0)
Platelets: 224 10*3/uL (ref 150–400)
RBC: 3.85 MIL/uL — ABNORMAL LOW (ref 3.87–5.11)
RDW: 13.3 % (ref 11.5–15.5)
WBC: 7.9 10*3/uL (ref 4.0–10.5)
nRBC: 0 % (ref 0.0–0.2)

## 2020-07-04 LAB — PROTIME-INR
INR: 1.3 — ABNORMAL HIGH (ref 0.8–1.2)
Prothrombin Time: 15.3 seconds — ABNORMAL HIGH (ref 11.4–15.2)

## 2020-07-04 MED ORDER — WARFARIN SODIUM 4 MG PO TABS
8.0000 mg | ORAL_TABLET | Freq: Once | ORAL | Status: AC
  Administered 2020-07-04: 17:00:00 8 mg via ORAL
  Filled 2020-07-04: qty 2

## 2020-07-04 NOTE — Consult Note (Addendum)
[place] for Enoxaparin Indication: hx of MVR  Allergies  Allergen Reactions  . Phenergan [Promethazine] Anaphylaxis  . Codeine Nausea And Vomiting  . Penicillins Rash    Patient Measurements: Height: 5\' 2"  (157.5 cm) Weight: 95.3 kg (210 lb) IBW/kg (Calculated) : 50.1  Vital Signs: Temp: 98.2 F (36.8 C) (12/17 0425) Temp Source: Oral (12/16 2006) BP: 139/47 (12/17 0425) Pulse Rate: 58 (12/17 0425)  Labs: Recent Labs    07/01/20 1354 07/02/20 0553 07/03/20 0551 07/04/20 0612  HGB 12.5  --   --  12.2  HCT 38.3  --   --  35.6*  PLT 226  --   --  224  APTT 39*  --   --   --   LABPROT 23.7* 22.6* 16.5*  --   INR 2.2* 2.1* 1.4*  --   CREATININE 0.80  --   --   --     Estimated Creatinine Clearance: 54.3 mL/min (by C-G formula based on SCr of 0.8 mg/dL).   Medical History: Past Medical History:  Diagnosis Date  . CHF (congestive heart failure) ([place])   . Colon cancer ([place])   . COPD (chronic obstructive pulmonary disease) ([place])   . Coronary artery disease   . GERD (gastroesophageal reflux disease)   . Hyperlipemia   . Hypertension   . Iron deficiency anemia   . Major depression   . Neuropathy   . Peripheral vascular disease ([place])   . Renal disorder   . Sleep apnea     Medications:  Medications Prior to Admission  Medication Sig Dispense Refill Last Dose  . acetaminophen (TYLENOL) 500 MG tablet Take 1,000 mg by mouth every 8 (eight) hours.   07/01/2020 at Unknown time  . albuterol (VENTOLIN HFA) 108 (90 Base) MCG/ACT inhaler Inhale 2 puffs into the lungs every 4 (four) hours as needed for wheezing or shortness of breath.   prn at prn  . albuterol (VENTOLIN HFA) 108 (90 Base) MCG/ACT inhaler Inhale 1 puff into the lungs 2 (two) times daily.   07/01/2020 at Unknown time  . ASPERCREME LIDOCAINE 4 % PTCH Apply 1 patch topically daily.   Past Week at Unknown time  . atorvastatin (LIPITOR) 20 MG tablet Take 20 mg by mouth at  bedtime.   Past Week at Unknown time  . Baclofen 5 MG TABS Take 5 mg by mouth 2 (two) times daily.   07/01/2020 at Unknown time  . busPIRone (BUSPAR) 10 MG tablet Take 10 mg by mouth 3 (three) times daily.   07/01/2020 at Unknown time  . cetirizine (ZYRTEC) 10 MG tablet Take 10 mg by mouth daily as needed for allergies.   prn at prn  . chlorthalidone (HYGROTON) 25 MG tablet Take 25 mg by mouth daily.   07/01/2020 at Unknown time  . Cholecalciferol 50 MCG (2000 UT) TABS Take 2,000 Units by mouth daily.   07/01/2020 at Unknown time  . Dextran 70-Hypromellose, PF, 0.1-0.3 % SOLN Place 1 drop into both eyes 2 (two) times daily.   07/01/2020 at Unknown time  . diclofenac Sodium (VOLTAREN) 1 % GEL Apply 2 g topically at bedtime. Apply to neck, right hip and left hand   Past Week at Unknown time  . gabapentin (NEURONTIN) 300 MG capsule Take 300 mg by mouth 2 (two) times daily.   07/01/2020 at Unknown time  . gabapentin (NEURONTIN) 400 MG capsule Take 400 mg by mouth at bedtime.   Past Week at Unknown  time  . guaiFENesin (ROBITUSSIN) 100 MG/5ML SOLN Take 5 mLs by mouth at bedtime as needed for cough or to loosen phlegm.   prn at prn  . losartan (COZAAR) 50 MG tablet Take 50 mg by mouth daily.   07/01/2020 at Unknown time  . melatonin 3 MG TABS tablet Take 9 mg by mouth at bedtime.   Past Week at Unknown time  . Omega-3 1000 MG CAPS Take 2 capsules by mouth daily.   07/01/2020 at Unknown time  . omeprazole (PRILOSEC) 20 MG capsule Take 20 mg by mouth daily.   07/01/2020 at Unknown time  . oxyCODONE (OXY IR/ROXICODONE) 5 MG immediate release tablet Take 5 mg by mouth every 6 (six) hours as needed for severe pain.   prn at prn  . oxyCODONE (OXY IR/ROXICODONE) 5 MG immediate release tablet Take 5 mg by mouth as directed. Take 5 mg once an evening, give 5 mg at bedtime   07/01/2020 at Unknown time  . polyethylene glycol (MIRALAX / GLYCOLAX) 17 g packet Take 17 g by mouth daily.   Past Week at Unknown time  .  senna (SENOKOT) 8.6 MG TABS tablet Take 2 tablets by mouth at bedtime.   Past Week at Unknown time  . sertraline (ZOLOFT) 100 MG tablet Take 200 mg by mouth daily.   07/01/2020 at Unknown time  . sodium chloride (OCEAN) 0.65 % SOLN nasal spray Place 1 spray into both nostrils every 12 (twelve) hours.   07/01/2020 at Unknown time  . warfarin (COUMADIN) 5 MG tablet Take 5 mg by mouth every evening.   06/30/2020 at 1749  . Wheat Dextrin (BENEFIBER DRINK MIX PO) Take 1 Scoop by mouth at bedtime.   Past Week at Unknown time   Scheduled:  .  stroke: mapping our early stages of recovery book   Does not apply Once  . atorvastatin  20 mg Oral QHS  . baclofen  5 mg Oral BID  . busPIRone  10 mg Oral TID  . Chlorhexidine Gluconate Cloth  6 each Topical Daily  . chlorthalidone  25 mg Oral Daily  . cholecalciferol  2,000 Units Oral Daily  . enoxaparin (LOVENOX) injection  1 mg/kg Subcutaneous Q12H  . gabapentin  300 mg Oral BID  . gabapentin  400 mg Oral QHS  . loratadine  10 mg Oral Daily  . losartan  50 mg Oral Daily  . melatonin  10 mg Oral QHS  . omega-3 acid ethyl esters  2 g Oral Daily  . pantoprazole  40 mg Oral Daily  . polyethylene glycol  17 g Oral Daily  . sertraline  200 mg Oral Daily  . sodium chloride flush  3 mL Intravenous Once  . Warfarin - Pharmacist Dosing Inpatient   Does not apply q1600   Infusions:   PRN:  Anti-infectives (From admission, onward)   None      Assessment: Pharmacy consulted to start enoxaparin with a hx of MVR. Concerns for possible branch retinal artery occulusion. Med rec not reviewed. Home warfarin dose was 5mg  nightly per patient. INR 2.2 on admit. CBC stable.   Date INR Dose comment 12/13 -- 5mg   PTA 12/14 2.2 -- LMWH bridge 12/15 2.1 6mg      Warfarin 6 mg was ordered for 12/16 instead of 12/15. Therefore, patient did not receive dose.  12/16   1.4       8 mg     Subtherapeutic as a result of two missed doses. Boosted with  60% increase.   12/17 1.3       ------     Today's INR is subtherapeutic.   Goal of Therapy:  MVR w/ goal INR 2.5-3.5 Monitor platelets by anticoagulation protocol: Yes   Plan:   Will order warfarin 8 mg (60% dose increase) x1 dose for 1600. Will check INR/CBC tomorrow morning.   Will continue Lovenox 1mg /kg Q12H.  Rowland Lathe, PharmD 07/04/2020,7:41 AM

## 2020-07-04 NOTE — Care Management Important Message (Signed)
Important Message  Patient Details  Name: Jennifer Guerra MRN: [id number] Date of Birth: Jun 13, 1932   Medicare Important Message Given:  Yes     [name] 07/04/2020, 11:37 AM

## 2020-07-04 NOTE — Progress Notes (Signed)
[facility] NAME: Jennifer Guerra    MR#:  [id number]  DATE OF BIRTH:  Sep 07, 1931  SUBJECTIVE:  Pt came in with left lower quadrant vision loss for few days. She is feeling about the same. No worsening or improvement in her vision.  No other focal deficit. No family in the room.  REVIEW OF SYSTEMS:   Review of Systems  Constitutional: Negative for chills, fever and weight loss.  HENT: Negative for ear discharge, ear pain and nosebleeds.   Eyes: Positive for blurred vision. Negative for pain and discharge.  Respiratory: Negative for sputum production, shortness of breath, wheezing and stridor.   Cardiovascular: Negative for chest pain, palpitations, orthopnea and PND.  Gastrointestinal: Negative for abdominal pain, diarrhea, nausea and vomiting.  Genitourinary: Negative for frequency and urgency.  Musculoskeletal: Negative for back pain and joint pain.  Neurological: Negative for sensory change, speech change, focal weakness and weakness.  Psychiatric/Behavioral: Negative for depression and hallucinations. The patient is not nervous/anxious.    Tolerating Diet: yes Tolerating PT: HHPT  DRUG ALLERGIES:   Allergies  Allergen Reactions  . Phenergan [Promethazine] Anaphylaxis  . Codeine Nausea And Vomiting  . Penicillins Rash    VITALS:  Blood pressure (!) 138/52, pulse 66, temperature 98.5 F (36.9 C), temperature source Oral, resp. rate 16, height 5\' 2"  (1.575 m), weight 95.3 kg, SpO2 97 %.  PHYSICAL EXAMINATION:   Physical Exam  GENERAL:  86 y.o.-year-old patient lying in the bed with no acute distress.  Left Lower quadrant vision loss LUNGS: Normal breath sounds bilaterally, no wheezing, rales, rhonchi. No use of accessory muscles of respiration.  CARDIOVASCULAR: S1, S2 normal. No murmurs, rubs, or gallops.  ABDOMEN: Soft, nontender, nondistended. Bowel sounds present. No organomegaly or mass.  EXTREMITIES: No  cyanosis, clubbing or edema b/l.    NEUROLOGIC: Cranial nerves II through XII are intact. No focal Motor or sensory deficits b/l.   PSYCHIATRIC:  patient is alert and oriented x 3.  SKIN: No obvious rash, lesion, or ulcer.   LABORATORY PANEL:  CBC Recent Labs  Lab 07/04/20 0612  WBC 7.9  HGB 12.2  HCT 35.6*  PLT 224    Chemistries  Recent Labs  Lab 07/01/20 1354  NA 140  K 4.0  CL 102  CO2 28  GLUCOSE 135*  BUN 15  CREATININE 0.80  CALCIUM 9.8  AST 30  ALT 22  ALKPHOS 42  BILITOT 1.0   Cardiac Enzymes No results for input(s): TROPONINI in the last 168 hours. RADIOLOGY:  ECHOCARDIOGRAM COMPLETE  Result Date: 07/03/2020    ECHOCARDIOGRAM REPORT   Patient Name:   Jennifer Guerra Date of Exam: 07/02/2020 Medical Rec #:  [id number]    Height:       62.0 in Accession #:    [id number]   Weight:       210.0 lb Date of Birth:  07-07-1932    BSA:          1.952 m Patient Age:    35 years     BP:           155/51 mmHg Patient Gender: F            HR:           72 bpm. Exam Location:  [facility] Procedure: 2D Echo, Cardiac Doppler and Color Doppler Indications:     I163.9 Stroke  History:         Patient  has no prior history of Echocardiogram examinations.                  Risk Factors:Obesity, Hypertension and Dyslipidemia. Sleep                  apnea. Peripheral vascular disease. Coronary artery disease.                  COPD. Congestive heart failure.  Sonographer:     Wilford Sports Rodgers-Jones Referring Phys:  [place] Diagnosing Phys: Bartholome Bill MD IMPRESSIONS  1. Left ventricular ejection fraction, by estimation, is 60 to 65%. The left ventricle has normal function. The left ventricle has no regional wall motion abnormalities. There is moderate left ventricular hypertrophy. Left ventricular diastolic parameters are consistent with Grade I diastolic dysfunction (impaired relaxation).  2. Right ventricular systolic function is normal. The right ventricular size is normal.  3. Left atrial  size was mildly dilated.  4. Right atrial size was mildly dilated.  5. The mitral valve was not well visualized. Trivial mitral valve regurgitation.  6. The aortic valve was not well visualized. Aortic valve regurgitation is trivial. Mild aortic valve sclerosis is present, with no evidence of aortic valve stenosis. FINDINGS  Left Ventricle: Left ventricular ejection fraction, by estimation, is 60 to 65%. The left ventricle has normal function. The left ventricle has no regional wall motion abnormalities. The left ventricular internal cavity size was normal in size. There is  moderate left ventricular hypertrophy. Left ventricular diastolic parameters are consistent with Grade I diastolic dysfunction (impaired relaxation). Right Ventricle: The right ventricular size is normal. No increase in right ventricular wall thickness. Right ventricular systolic function is normal. Left Atrium: Left atrial size was mildly dilated. Right Atrium: Right atrial size was mildly dilated. Pericardium: There is no evidence of pericardial effusion. Mitral Valve: The mitral valve was not well visualized. Trivial mitral valve regurgitation. MV peak gradient, 6.9 mmHg. The mean mitral valve gradient is 4.0 mmHg. Tricuspid Valve: The tricuspid valve is not well visualized. Tricuspid valve regurgitation is trivial. Aortic Valve: The aortic valve was not well visualized. Aortic valve regurgitation is trivial. Mild aortic valve sclerosis is present, with no evidence of aortic valve stenosis. Aortic valve mean gradient measures 9.3 mmHg. Aortic valve peak gradient measures 15.2 mmHg. Aortic valve area, by VTI measures 1.80 cm. Pulmonic Valve: The pulmonic valve was not well visualized. Pulmonic valve regurgitation is not visualized. Aorta: The aortic root was not well visualized. IAS/Shunts: The interatrial septum was not assessed.  LEFT VENTRICLE PLAX 2D LVIDd:         4.42 cm  Diastology LVIDs:         3.07 cm  LV e' medial:    5.33 cm/s LV  PW:         1.00 cm  LV E/e' medial:  23.1 LV IVS:        1.00 cm  LV e' lateral:   7.07 cm/s LVOT diam:     2.00 cm  LV E/e' lateral: 17.4 LV SV:         71 LV SV Index:   37 LVOT Area:     3.14 cm  RIGHT VENTRICLE RV Basal diam:  3.48 cm RV S prime:     11.80 cm/s TAPSE (M-mode): 1.3 cm LEFT ATRIUM             Index       RIGHT ATRIUM  Index LA diam:        4.90 cm 2.51 cm/m  RA Area:     12.40 cm LA Vol (A2C):   49.5 ml 25.36 ml/m RA Volume:   32.80 ml  16.81 ml/m LA Vol (A4C):   75.2 ml 38.53 ml/m LA Biplane Vol: 61.5 ml 31.51 ml/m  AORTIC VALVE AV Area (Vmax):    1.60 cm AV Area (Vmean):   1.81 cm AV Area (VTI):     1.80 cm AV Vmax:           194.67 cm/s AV Vmean:          143.333 cm/s AV VTI:            0.398 m AV Peak Grad:      15.2 mmHg AV Mean Grad:      9.3 mmHg LVOT Vmax:         99.15 cm/s LVOT Vmean:        82.700 cm/s LVOT VTI:          0.227 m LVOT/AV VTI ratio: 0.57  AORTA Ao Root diam: 3.20 cm MITRAL VALVE MV Area (PHT): 2.45 cm     SHUNTS MV Peak grad:  6.9 mmHg     Systemic VTI:  0.23 m MV Mean grad:  4.0 mmHg     Systemic Diam: 2.00 cm MV Vmax:       1.32 m/s MV Vmean:      92.9 cm/s MV Decel Time: 310 msec MV E velocity: 123.00 cm/s MV A velocity: 114.00 cm/s MV E/A ratio:  1.08 Bartholome Bill MD Electronically signed by Bartholome Bill MD Signature Date/Time: 07/03/2020/5:26:21 PM    Final    ASSESSMENT AND PLAN:  Kerby Hockley is a 86 y.o. female past history of CHF, mitral valve repair on Coumadin, COPD, colon cancer, major depression, peripheral vascular disease comes in for evaluation of vision loss in the left eye.  She reports last known well about a week ago when she noted that she could not see anything past a certain point in the lower visual fields only from the left eye.  She went to her ophthalmologist who was concerned for a BRAO and sent for stroke work-up.  # L lower visual field loss in setting of Mitral valve replacement  and subther INR -Ruled out  CVA -MRI brain neg for CVA -CTA head and neck neg for CA stenosis -appreciate neurology recommendation. Patient will need to follow-up ophthalmology as outpatient for further management. -Physical therapy recommends home health PT -continue warfarin-- to maintain INR between 2.5 and 3.5. -- Coumadin dose to be managed by pharmacy. --12/16-- apparently patient did not get her Coumadin dose yesterday due to error in ordering per pharmacy. INR down to 1.4 -- will place patient on Lovenox bridge and give Coumadin 8 mg  --12/17-- INR 1.3--cont with lovenox and coumadin  #. History of  COPD -- stable   # HTN -- resume losartan and chlorhtalidone  #HLD -- continue statins  # GERD -- continue Protonix      Procedures: Family communication : discussed with daughter Neoma Laming on the phone.  consults : neurology CODE STATUS: full DVT Prophylaxis : Coumadin  Status is: Inpatient  Remains inpatient appropriate because:Inpatient level of care appropriate due to severity of illness   Dispo: The patient is from: SNF              Anticipated d/c is to: SNF  Anticipated d/c date is: 1 day              Patient currently is not medically stable to d/c.  Patient came in with loss of vision. Her INR is marginally sub therapeutic. Will increase dose of Coumadin today in anticipation for INR to be therapeutic between 2.5 to 3.5. Echo pending. Likely discharge back to her facility tomorrow.      TOTAL TIME TAKING CARE OF THIS PATIENT: *25* minutes.  >50% time spent on counselling and coordination of care  Note: This dictation was prepared with Dragon dictation along with smaller phrase technology. Any transcriptional errors that result from this process are unintentional.  Fritzi Mandes M.D    [facility]   CC: Primary care physician; Pcp, NoPatient ID: Maurice Small, female   DOB: 13-Aug-1931, 86 y.o.   MRN: [id number]

## 2020-07-04 NOTE — Plan of Care (Signed)
Echo unremarkable. No new recs. Please call neurology as needed.  -- Amie Portland, MD [facility]

## 2020-07-04 NOTE — Plan of Care (Signed)
Pt resting in bed at this time,alert and verbal,communicates needs appropriately.NAD noted.Pt ambulates with a RW to go void in the RR.Appetite and fluid intake good.PICC line on RFA patent,c/d/I.Pt demonstrated and expressed understanding of care plans.will cont to monitor.

## 2020-07-05 LAB — CBC
HCT: 34.5 % — ABNORMAL LOW (ref 36.0–46.0)
Hemoglobin: 12.1 g/dL (ref 12.0–15.0)
MCH: 32.2 pg (ref 26.0–34.0)
MCHC: 35.1 g/dL (ref 30.0–36.0)
MCV: 91.8 fL (ref 80.0–100.0)
Platelets: 219 10*3/uL (ref 150–400)
RBC: 3.76 MIL/uL — ABNORMAL LOW (ref 3.87–5.11)
RDW: 13.2 % (ref 11.5–15.5)
WBC: 7.5 10*3/uL (ref 4.0–10.5)
nRBC: 0 % (ref 0.0–0.2)

## 2020-07-05 LAB — SARS CORONAVIRUS 2 BY RT PCR (HOSPITAL ORDER, PERFORMED IN ~~LOC~~ HOSPITAL LAB): SARS Coronavirus 2: NEGATIVE

## 2020-07-05 LAB — PROTIME-INR
INR: 1.7 — ABNORMAL HIGH (ref 0.8–1.2)
Prothrombin Time: 19.1 seconds — ABNORMAL HIGH (ref 11.4–15.2)

## 2020-07-05 MED ORDER — WARFARIN SODIUM 6 MG PO TABS
6.0000 mg | ORAL_TABLET | Freq: Once | ORAL | Status: AC
  Administered 2020-07-05: 12:00:00 6 mg via ORAL
  Filled 2020-07-05: qty 1

## 2020-07-05 MED ORDER — WARFARIN SODIUM 5 MG PO TABS: 5.0000 mg | ORAL_TABLET | Freq: Every evening | ORAL | 0 refills | Status: AC

## 2020-07-05 MED ORDER — ENOXAPARIN SODIUM 100 MG/ML ~~LOC~~ SOLN: 1.0000 mg/kg | Freq: Two times a day (BID) | SUBCUTANEOUS | 0 refills | Status: AC

## 2020-07-05 NOTE — Consult Note (Signed)
[place] for Enoxaparin and Warfarin Indication: hx of MVR  Allergies  Allergen Reactions  . Phenergan [Promethazine] Anaphylaxis  . Codeine Nausea And Vomiting  . Penicillins Rash    Patient Measurements: Height: 5\' 2"  (157.5 cm) Weight: 95.3 kg (210 lb) IBW/kg (Calculated) : 50.1  Vital Signs: Temp: 98.3 F (36.8 C) (12/18 0841) Temp Source: Oral (12/18 0841) BP: 148/49 (12/18 0841) Pulse Rate: 54 (12/18 0841)  Labs: Recent Labs    07/03/20 0551 07/04/20 0612 07/05/20 0737  HGB  --  12.2 12.1  HCT  --  35.6* 34.5*  PLT  --  224 219  LABPROT 16.5* 15.3* 19.1*  INR 1.4* 1.3* 1.7*    Estimated Creatinine Clearance: 54.3 mL/min (by C-G formula based on SCr of 0.8 mg/dL).   Medical History: Past Medical History:  Diagnosis Date  . CHF (congestive heart failure) ([place])   . Colon cancer ([place])   . COPD (chronic obstructive pulmonary disease) ([place])   . Coronary artery disease   . GERD (gastroesophageal reflux disease)   . Hyperlipemia   . Hypertension   . Iron deficiency anemia   . Major depression   . Neuropathy   . Peripheral vascular disease ([place])   . Renal disorder   . Sleep apnea     Medications:  Medications Prior to Admission  Medication Sig Dispense Refill Last Dose  . acetaminophen (TYLENOL) 500 MG tablet Take 1,000 mg by mouth every 8 (eight) hours.   07/01/2020 at Unknown time  . albuterol (VENTOLIN HFA) 108 (90 Base) MCG/ACT inhaler Inhale 2 puffs into the lungs every 4 (four) hours as needed for wheezing or shortness of breath.   prn at prn  . albuterol (VENTOLIN HFA) 108 (90 Base) MCG/ACT inhaler Inhale 1 puff into the lungs 2 (two) times daily.   07/01/2020 at Unknown time  . ASPERCREME LIDOCAINE 4 % PTCH Apply 1 patch topically daily.   Past Week at Unknown time  . atorvastatin (LIPITOR) 20 MG tablet Take 20 mg by mouth at bedtime.   Past Week at Unknown time  . Baclofen 5 MG TABS Take 5 mg by mouth 2  (two) times daily.   07/01/2020 at Unknown time  . busPIRone (BUSPAR) 10 MG tablet Take 10 mg by mouth 3 (three) times daily.   07/01/2020 at Unknown time  . cetirizine (ZYRTEC) 10 MG tablet Take 10 mg by mouth daily as needed for allergies.   prn at prn  . chlorthalidone (HYGROTON) 25 MG tablet Take 25 mg by mouth daily.   07/01/2020 at Unknown time  . Cholecalciferol 50 MCG (2000 UT) TABS Take 2,000 Units by mouth daily.   07/01/2020 at Unknown time  . Dextran 70-Hypromellose, PF, 0.1-0.3 % SOLN Place 1 drop into both eyes 2 (two) times daily.   07/01/2020 at Unknown time  . diclofenac Sodium (VOLTAREN) 1 % GEL Apply 2 g topically at bedtime. Apply to neck, right hip and left hand   Past Week at Unknown time  . gabapentin (NEURONTIN) 300 MG capsule Take 300 mg by mouth 2 (two) times daily.   07/01/2020 at Unknown time  . gabapentin (NEURONTIN) 400 MG capsule Take 400 mg by mouth at bedtime.   Past Week at Unknown time  . guaiFENesin (ROBITUSSIN) 100 MG/5ML SOLN Take 5 mLs by mouth at bedtime as needed for cough or to loosen phlegm.   prn at prn  . losartan (COZAAR) 50 MG tablet Take 50 mg by  mouth daily.   07/01/2020 at Unknown time  . melatonin 3 MG TABS tablet Take 9 mg by mouth at bedtime.   Past Week at Unknown time  . Omega-3 1000 MG CAPS Take 2 capsules by mouth daily.   07/01/2020 at Unknown time  . omeprazole (PRILOSEC) 20 MG capsule Take 20 mg by mouth daily.   07/01/2020 at Unknown time  . oxyCODONE (OXY IR/ROXICODONE) 5 MG immediate release tablet Take 5 mg by mouth every 6 (six) hours as needed for severe pain.   prn at prn  . oxyCODONE (OXY IR/ROXICODONE) 5 MG immediate release tablet Take 5 mg by mouth as directed. Take 5 mg once an evening, give 5 mg at bedtime   07/01/2020 at Unknown time  . polyethylene glycol (MIRALAX / GLYCOLAX) 17 g packet Take 17 g by mouth daily.   Past Week at Unknown time  . senna (SENOKOT) 8.6 MG TABS tablet Take 2 tablets by mouth at bedtime.   Past Week  at Unknown time  . sertraline (ZOLOFT) 100 MG tablet Take 200 mg by mouth daily.   07/01/2020 at Unknown time  . sodium chloride (OCEAN) 0.65 % SOLN nasal spray Place 1 spray into both nostrils every 12 (twelve) hours.   07/01/2020 at Unknown time  . warfarin (COUMADIN) 5 MG tablet Take 5 mg by mouth every evening.   06/30/2020 at 1749  . Wheat Dextrin (BENEFIBER DRINK MIX PO) Take 1 Scoop by mouth at bedtime.   Past Week at Unknown time   Scheduled:  .  stroke: mapping our early stages of recovery book   Does not apply Once  . atorvastatin  20 mg Oral QHS  . baclofen  5 mg Oral BID  . busPIRone  10 mg Oral TID  . Chlorhexidine Gluconate Cloth  6 each Topical Daily  . chlorthalidone  25 mg Oral Daily  . cholecalciferol  2,000 Units Oral Daily  . enoxaparin (LOVENOX) injection  1 mg/kg Subcutaneous Q12H  . gabapentin  300 mg Oral BID  . gabapentin  400 mg Oral QHS  . loratadine  10 mg Oral Daily  . losartan  50 mg Oral Daily  . melatonin  10 mg Oral QHS  . omega-3 acid ethyl esters  2 g Oral Daily  . pantoprazole  40 mg Oral Daily  . polyethylene glycol  17 g Oral Daily  . sertraline  200 mg Oral Daily  . warfarin  6 mg Oral Once  . Warfarin - Pharmacist Dosing Inpatient   Does not apply q1600   Infusions:   PRN:  Anti-infectives (From admission, onward)   None      Assessment: 86 y.o. female with a known history of CHF, COPD, CAD, HTN, HLD, GERD, IDA, depression, PVD, OSA, MVR on Coumadin. Pharmacy consulted to start enoxaparin with a hx of MVR. Concerns for possible branch retinal artery occulusion. Med rec not reviewed. Home warfarin dose was 5mg  nightly per patient. INR 2.2 on admit. CBC stable.   Date INR Dose comment 12/13 -- 5mg   PTA 12/14 2.2 -- [facility] bridge 12/15 2.1 6mg      Warfarin 6 mg was ordered for 12/16 instead of 12/15. Therefore, patient did not receive dose.  12/16   1.4       8 mg     Subtherapeutic as a result of two missed doses. Boosted with 60%  increase.  12/17 1.3       8mg  12/18 1.7     Goal  of Therapy:  MVR w/ goal INR 2.5-3.5 Monitor platelets by anticoagulation protocol: Yes   Plan:   INR 1.7 subtherapeutic, increased from 1.3 to 1.7. Will order warfarin 6mg  x1 dose for 1300 in anticipation for possible pt discharge. Will check INR/CBC tomorrow morning  Will continue Lovenox 1mg /kg Q12H  Sherilyn Banker, PharmD Pharmacy Resident  07/05/2020 9:15 AM

## 2020-07-05 NOTE — Progress Notes (Signed)
DC paper was given to pt and daughter with 1 dose of Lovenox. Midline was removed. Daughter send pt to the SNF. Report was given to [facility].

## 2020-07-05 NOTE — Discharge Instructions (Addendum)
FOR [facility]  1) check PT/INR on Monday dec 20th  2) Cont SQ lovenox dosing bid till INR is between 2.5--3.5--there after stop  3) pt will need to follow-up with ophthalmologist in one week.

## 2020-07-05 NOTE — Discharge Summary (Signed)
[name] NAME: Jennifer Guerra    MR#:  [id number]  DATE OF BIRTH:  09-14-1931  DATE OF ADMISSION:  07/01/2020 ADMITTING PHYSICIAN: Fritzi Mandes, MD  DATE OF DISCHARGE: 07/05/2020  PRIMARY CARE PHYSICIAN: Pcp, No    ADMISSION DIAGNOSIS:  Vision disturbance [H53.9] Vision loss of left eye [H54.62] Vision changes [H53.9]  DISCHARGE DIAGNOSIS:  Left eye vision loss (left lower quadrant) Chronic anticoagulation due to h/o MVR  SECONDARY DIAGNOSIS:   Past Medical History:  Diagnosis Date  . CHF (congestive heart failure) ([place])   . Colon cancer ([place])   . COPD (chronic obstructive pulmonary disease) ([place])   . Coronary artery disease   . GERD (gastroesophageal reflux disease)   . Hyperlipemia   . Hypertension   . Iron deficiency anemia   . Major depression   . Neuropathy   . Peripheral vascular disease ([place])   . Renal disorder   . Sleep apnea     HOSPITAL COURSE:   Jennifer Guerra a 86 y.o.femalepast history of CHF, mitral valve repair on Coumadin, COPD, colon cancer, major depression, peripheral vascular disease comes in for evaluation of vision loss in the left eye. She reports last known well about a week ago when she noted that she could not see anything past a certain point in the lower visual fields only from the left eye. She went to her ophthalmologist who was concerned for a BRAO and sent for stroke work-up.  #L lower visual field loss in setting of Mitral valve replacement  and subther INR -Ruled out CVA -MRI brain neg for CVA -CTA head and neck neg for CA stenosis -appreciate neurology recommendation. Patient will need to follow-up ophthalmology as outpatient for further management. -Physical therapy recommends home health PT -continue warfarin-- to maintain INR between 2.5 and 3.5. -- Coumadin dose to be managed by pharmacy. --12/16-- apparently patient did not get her Coumadin dose yesterday due to error  in ordering per pharmacy. INR down to 1.4 -- will place patient on Lovenox bridge and give Coumadin 8 mg  --12/17-- INR 1.3--cont with lovenox and coumadin --12/18--INR 1.7-- patient will receive her warfarin 6 mg today prior to discharge and she will continue Lovenox 1 mg per kilogram BID subcu at the facility till her INR is therapeutic between 2.5 to 3.5. -- [facility] been given instruction to check INR on Monday, December 20 th and follow further instructions regarding Coumadin dosing per MD at the facility  #.History of  COPD -- stable   # HTN -- resume losartan and chlorhtalidone  #HLD -- continue statins  # GERD -- continue Protonix  overall stable for discharge.   Procedures: Family communication : above discharge plan was discussed with daughter Neoma Laming on the phone.  consults : neurology CODE STATUS: full DVT Prophylaxis : Coumadin  Status is: Inpatient  Remains inpatient appropriate because:Inpatient level of care appropriate due to severity of illness   Dispo: The patient is from: SNF  Anticipated d/c is to: SNF  Anticipated d/c date is: today  Patient currently is medically stable to d/c.   CONSULTS OBTAINED:    DRUG ALLERGIES:   Allergies  Allergen Reactions  . Phenergan [Promethazine] Anaphylaxis  . Codeine Nausea And Vomiting  . Penicillins Rash    DISCHARGE MEDICATIONS:   Allergies as of 07/05/2020      Reactions   Phenergan [promethazine] Anaphylaxis   Codeine Nausea And Vomiting   Penicillins  Rash      Medication List    TAKE these medications   acetaminophen 500 MG tablet Commonly known as: TYLENOL Take 1,000 mg by mouth every 8 (eight) hours.   albuterol 108 (90 Base) MCG/ACT inhaler Commonly known as: VENTOLIN HFA Inhale 2 puffs into the lungs every 4 (four) hours as needed for wheezing or shortness of breath.   albuterol 108 (90 Base) MCG/ACT  inhaler Commonly known as: VENTOLIN HFA Inhale 1 puff into the lungs 2 (two) times daily.   Aspercreme Lidocaine 4 % Ptch Generic drug: Lidocaine Apply 1 patch topically daily.   atorvastatin 20 MG tablet Commonly known as: LIPITOR Take 20 mg by mouth at bedtime.   Baclofen 5 MG Tabs Take 5 mg by mouth 2 (two) times daily.   BENEFIBER DRINK MIX PO Take 1 Scoop by mouth at bedtime.   busPIRone 10 MG tablet Commonly known as: BUSPAR Take 10 mg by mouth 3 (three) times daily.   cetirizine 10 MG tablet Commonly known as: ZYRTEC Take 10 mg by mouth daily as needed for allergies.   chlorthalidone 25 MG tablet Commonly known as: HYGROTON Take 25 mg by mouth daily.   Cholecalciferol 50 MCG (2000 UT) Tabs Take 2,000 Units by mouth daily.   Dextran 70-Hypromellose (PF) 0.1-0.3 % Soln Place 1 drop into both eyes 2 (two) times daily.   diclofenac Sodium 1 % Gel Commonly known as: VOLTAREN Apply 2 g topically at bedtime. Apply to neck, right hip and left hand   enoxaparin 100 MG/ML injection Commonly known as: LOVENOX Inject 0.95 mLs (95 mg total) into the skin every 12 (twelve) hours. STOP once INR is between 2.5--3.5   gabapentin 300 MG capsule Commonly known as: NEURONTIN Take 300 mg by mouth 2 (two) times daily.   gabapentin 400 MG capsule Commonly known as: NEURONTIN Take 400 mg by mouth at bedtime.   guaiFENesin 100 MG/5ML Soln Commonly known as: ROBITUSSIN Take 5 mLs by mouth at bedtime as needed for cough or to loosen phlegm.   losartan 50 MG tablet Commonly known as: COZAAR Take 50 mg by mouth daily.   melatonin 3 MG Tabs tablet Take 9 mg by mouth at bedtime.   Omega-3 1000 MG Caps Take 2 capsules by mouth daily.   omeprazole 20 MG capsule Commonly known as: PRILOSEC Take 20 mg by mouth daily.   oxyCODONE 5 MG immediate release tablet Commonly known as: Oxy IR/ROXICODONE Take 5 mg by mouth as directed. Take 5 mg once an evening, give 5 mg at  bedtime What changed: Another medication with the same name was removed. Continue taking this medication, and follow the directions you see here.   polyethylene glycol 17 g packet Commonly known as: MIRALAX / GLYCOLAX Take 17 g by mouth daily.   senna 8.6 MG Tabs tablet Commonly known as: SENOKOT Take 2 tablets by mouth at bedtime.   sertraline 100 MG tablet Commonly known as: ZOLOFT Take 200 mg by mouth daily.   sodium chloride 0.65 % Soln nasal spray Commonly known as: OCEAN Place 1 spray into both nostrils every 12 (twelve) hours.   warfarin 5 MG tablet Commonly known as: COUMADIN Take 1 tablet (5 mg total) by mouth every evening. Start taking on: July 06, 2020       If you experience worsening of your admission symptoms, develop shortness of breath, life threatening emergency, suicidal or homicidal thoughts you must seek medical attention immediately by calling 911 or calling your  MD immediately  if symptoms less severe.  You Must read complete instructions/literature along with all the possible adverse reactions/side effects for all the Medicines you take and that have been prescribed to you. Take any new Medicines after you have completely understood and accept all the possible adverse reactions/side effects.   Please note  You were cared for by a hospitalist during your hospital stay. If you have any questions about your discharge medications or the care you received while you were in the hospital after you are discharged, you can call the unit and asked to speak with the hospitalist on call if the hospitalist that took care of you is not available. Once you are discharged, your primary care physician will handle any further medical issues. Please note that NO REFILLS for any discharge medications will be authorized once you are discharged, as it is imperative that you return to your primary care physician (or establish a relationship with a primary care physician if you  do not have one) for your aftercare needs so that they can reassess your need for medications and monitor your lab values. Today   SUBJECTIVE   Doing well  VITAL SIGNS:  Blood pressure (!) 146/40, pulse 63, temperature 99.1 F (37.3 C), temperature source Oral, resp. rate 18, height 5\' 2"  (1.575 m), weight 95.3 kg, SpO2 96 %.  I/O:    Intake/Output Summary (Last 24 hours) at 07/05/2020 1256 Last data filed at 07/04/2020 1859 Gross per 24 hour  Intake 360 ml  Output --  Net 360 ml    PHYSICAL EXAMINATION:  GENERAL:  86 y.o.-year-old patient lying in the bed with no acute distress.  EYES:left lower quad vision loss  LUNGS: Normal breath sounds bilaterally, no wheezing, rales,rhonchi or crepitation. No use of accessory muscles of respiration.  CARDIOVASCULAR: S1, S2 normal. No murmurs, rubs, or gallops.  ABDOMEN: Soft, non-tender, non-distended. Bowel sounds present. No organomegaly or mass.  EXTREMITIES: No pedal edema, cyanosis, or clubbing.  NEUROLOGIC: Cranial nerves II through XII are intact. Muscle strength 5/5 in all extremities. Sensation intact. Gait not checked.  PSYCHIATRIC: The patient is alert and oriented x 3.  SKIN: No obvious rash, lesion, or ulcer.   DATA REVIEW:   CBC  Recent Labs  Lab 07/05/20 0737  WBC 7.5  HGB 12.1  HCT 34.5*  PLT 219    Chemistries  Recent Labs  Lab 07/01/20 1354  NA 140  K 4.0  CL 102  CO2 28  GLUCOSE 135*  BUN 15  CREATININE 0.80  CALCIUM 9.8  AST 30  ALT 22  ALKPHOS 42  BILITOT 1.0    Microbiology Results   Recent Results (from the past 240 hour(s))  Resp Panel by RT-PCR (Flu A&B, Covid) Nasopharyngeal Swab     Status: None   Collection Time: 07/01/20  9:07 PM   Specimen: Nasopharyngeal Swab; Nasopharyngeal(NP) swabs in vial transport medium  Result Value Ref Range Status   SARS Coronavirus 2 by RT PCR NEGATIVE NEGATIVE Final    Comment: (NOTE) SARS-CoV-2 target nucleic acids are NOT DETECTED.  The  SARS-CoV-2 RNA is generally detectable in upper respiratory specimens during the acute phase of infection. The lowest concentration of SARS-CoV-2 viral copies this assay can detect is 138 copies/mL. A negative result does not preclude SARS-Cov-2 infection and should not be used as the sole basis for treatment or other patient management decisions. A negative result may occur with  improper specimen collection/handling, submission of specimen other than nasopharyngeal  swab, presence of viral mutation(s) within the areas targeted by this assay, and inadequate number of viral copies(<138 copies/mL). A negative result must be combined with clinical observations, patient history, and epidemiological information. The expected result is Negative.  Fact Sheet for Patients:  EntrepreneurPulse.com.au  Fact Sheet for Healthcare Providers:  IncredibleEmployment.be  This test is no t yet approved or cleared by the [place] FDA and  has been authorized for detection and/or diagnosis of SARS-CoV-2 by FDA under an Emergency Use Authorization (EUA). This EUA will remain  in effect (meaning this test can be used) for the duration of the COVID-19 declaration under Section 564(b)(1) of the Act, 21 U.S.C.section 360bbb-3(b)(1), unless the authorization is terminated  or revoked sooner.       Influenza A by PCR NEGATIVE NEGATIVE Final   Influenza B by PCR NEGATIVE NEGATIVE Final    Comment: (NOTE) The Xpert Xpress SARS-CoV-2/FLU/RSV plus assay is intended as an aid in the diagnosis of influenza from Nasopharyngeal swab specimens and should not be used as a sole basis for treatment. Nasal washings and aspirates are unacceptable for Xpert Xpress SARS-CoV-2/FLU/RSV testing.  Fact Sheet for Patients: EntrepreneurPulse.com.au  Fact Sheet for Healthcare Providers: IncredibleEmployment.be  This test is not yet approved or  cleared by the [place] FDA and has been authorized for detection and/or diagnosis of SARS-CoV-2 by FDA under an Emergency Use Authorization (EUA). This EUA will remain in effect (meaning this test can be used) for the duration of the COVID-19 declaration under Section 564(b)(1) of the Act, 21 U.S.C. section 360bbb-3(b)(1), unless the authorization is terminated or revoked.  Performed at [facility], [address] [place], [place] [address]     RADIOLOGY:  No results found.   CODE STATUS:     Code Status Orders  (From admission, onward)         Start     Ordered   07/01/20 2228  Full code  Continuous        07/01/20 2227        Code Status History    This patient has a current code status but no historical code status.   Advance Care Planning Activity    Advance Directive Documentation   Flowsheet Row Most Recent Value  Type of Advance Directive Healthcare Power of Attorney, Living will  Pre-existing out of facility DNR order (yellow form or pink MOST form) --  "MOST" Form in Place? --       TOTAL TIME TAKING CARE OF THIS PATIENT: *35* minutes.    Fritzi Mandes M.D  [facility]    CC: Primary care physician; Pcp, No

## 2021-06-01 IMAGING — CT CT ANGIO NECK
1 of 10 series · 6 of 33 positions shown · IV contrast (omnipaque)
Comparison: 07/01/2020. 09/03/2013 outside CT neck report.
03/15/2014 outside CT chest report.

CLINICAL DATA: Carotid artery stenosis

EXAM:
CT ANGIOGRAPHY HEAD AND NECK
TECHNIQUE: Multidetector CT imaging of the head and neck was performed using
the standard protocol during bolus administration of intravenous
contrast. Multiplanar CT image reconstructions and MIPs were
obtained to evaluate the vascular anatomy. Carotid stenosis
measurements (when applicable) are obtained utilizing NASCET
criteria, using the distal internal carotid diameter as the
denominator.
CONTRAST:  75mL OMNIPAQUE IOHEXOL 350 MG/ML SOLN

[Series 10: ax thin · axial · 0.41mm/px · z∈[-316,-72]mm · 6 of 343 slices shown]
[im 49/343  soft-tissue]
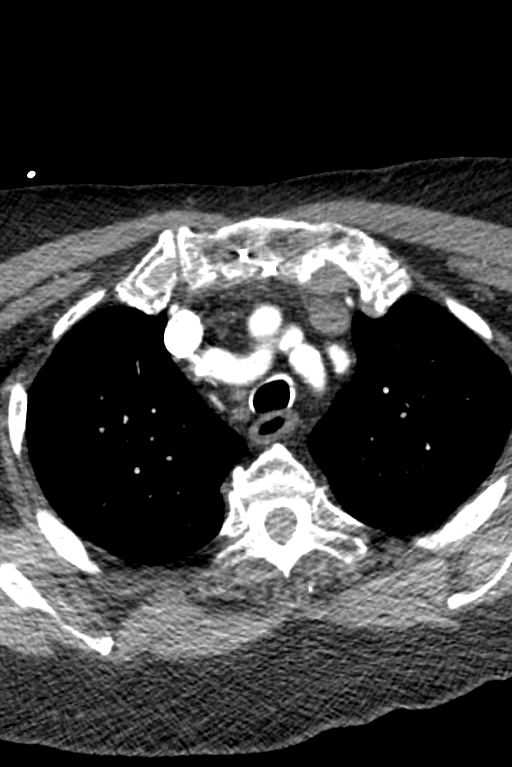
[im 98/343  bone]
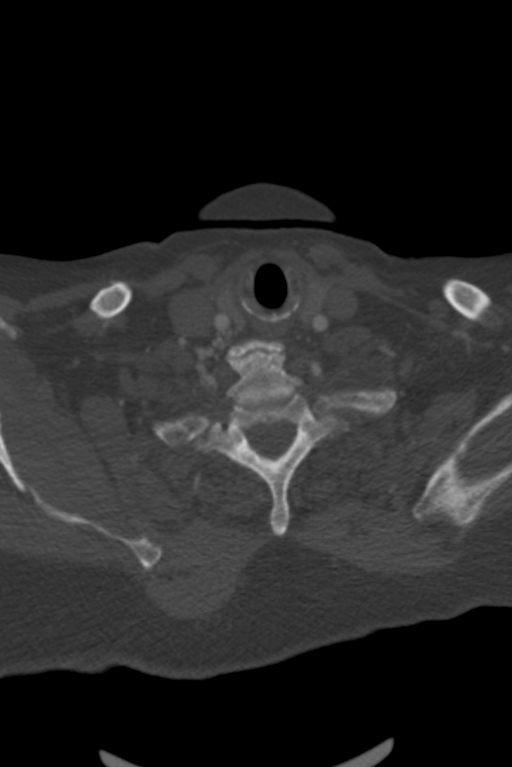
[im 147/343  soft-tissue]
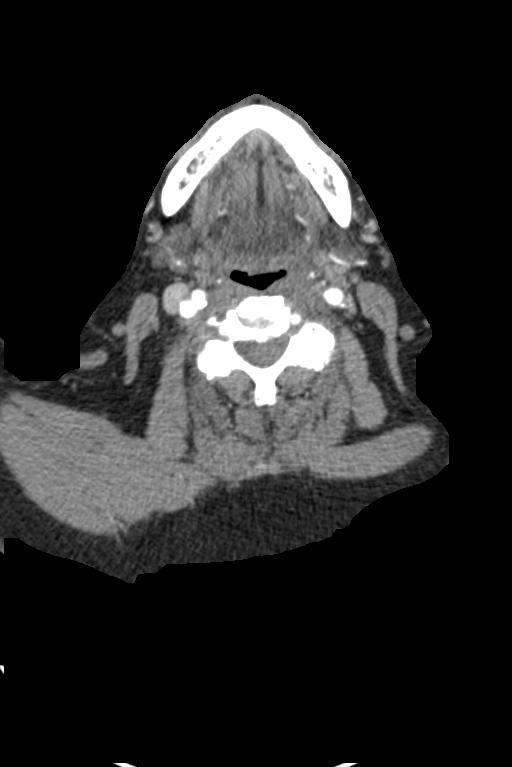
[im 196/343  bone]
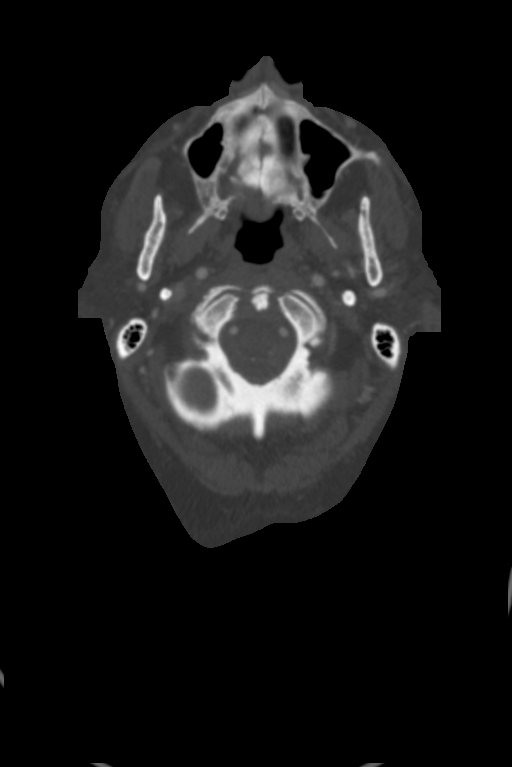
[im 245/343  soft-tissue]
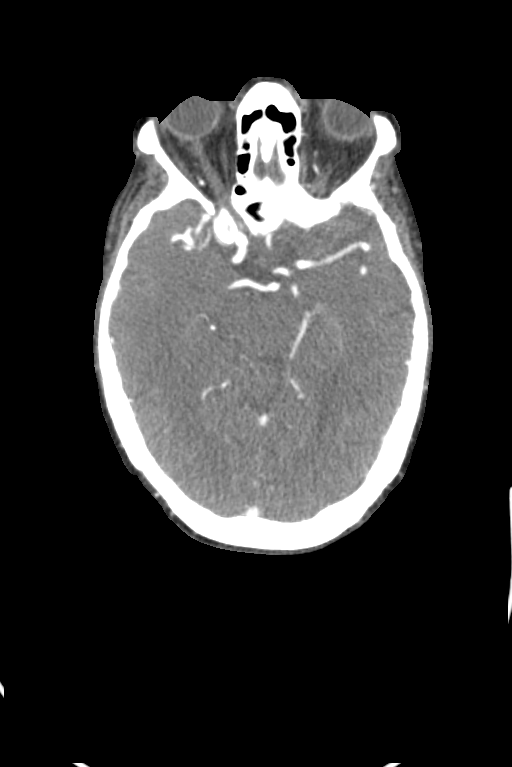
[im 294/343  bone]
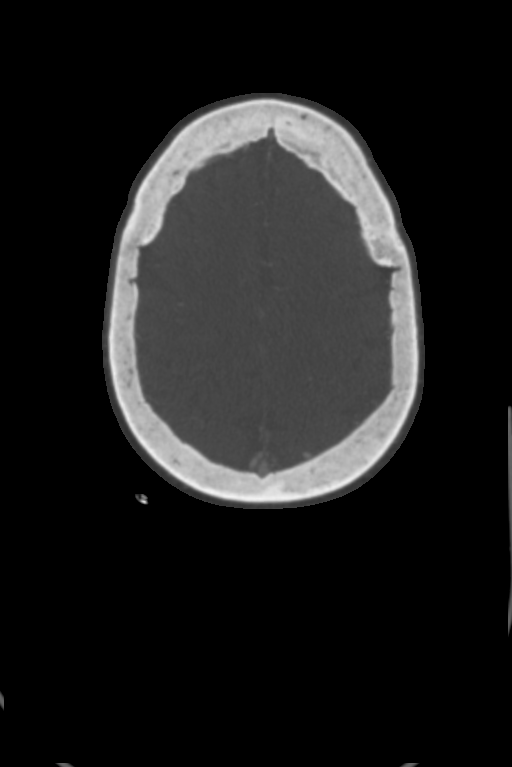

[6 of 33 positions shown; findings below may reference images not displayed]

FINDINGS: CT HEAD FINDINGS

Brain: No acute infarct or intracranial hemorrhage. No mass lesion.
No midline shift, ventriculomegaly or extra-axial fluid collection.
Chronic left thalamic lacunar insult is better demonstrated on prior
MRI.

Vascular: No hyperdense vessel or unexpected calcification.
Bilateral skull base atherosclerotic calcifications.

Skull: Negative for fracture or focal lesion.

Sinuses/Orbits: Normal orbits. Clear paranasal sinuses. No mastoid
effusion.

Other: None.

Review of the MIP images confirms the above findings

CTA NECK FINDINGS

Aortic arch: Mild aortic arch atherosclerotic calcifications.
Standard branching. Imaged portion shows no evidence of aneurysm or
dissection. No significant stenosis of the major arch vessel
origins.

Right carotid system: Patent. Carotid bifurcation atherosclerotic
calcifications. No evidence of dissection, stenosis (50% or greater)
or occlusion.

Left carotid system: Patent. Carotid bifurcation atherosclerotic
calcifications. No evidence of dissection, stenosis (50% or greater)
or occlusion.

Vertebral arteries: Codominant. No evidence of dissection, stenosis
(50% or greater) or occlusion.

Skeleton: No acute finding. Post sternotomy sequela. Multilevel
spondylosis. Sequela of ACDF.

Other neck: Enhancing 3.9 x 2.3 x 2.7 cm right tracheoesophageal
groove mass dorsal to the right thyroid abutting the proximal
subclavian and right CCA.

Upper chest: No acute finding.

Review of the MIP images confirms the above findings

CTA HEAD FINDINGS

Anterior circulation: Patent ICAs. Patent anterior cerebral
arteries. Patent MCAs. No significant stenosis, proximal occlusion,
aneurysm, or vascular malformation.

Posterior circulation: Patent V4 segments and proximal PICA. Patent
basilar artery. Patent right PCA. Patent left PCA. Superior
cerebellar arteries are patent. No significant stenosis, proximal
occlusion, aneurysm, or vascular malformation.

Venous sinuses: As permitted by contrast timing, patent.

Anatomic variants: None.

Review of the MIP images confirms the above findings
IMPRESSION: Head CT: No acute intracranial process.

Head CTA: No large vessel occlusion, high-grade narrowing,
dissection or aneurysm.

Neck CTA: No large vessel occlusion, high-grade narrowing,
dissection or aneurysm.

3.9 cm enhancing right lower paratracheal mass has been present
since 4606. Consider obtaining outside images for better assessment
of interval change.

## 2022-03-17 ENCOUNTER — Other Ambulatory Visit: Payer: Self-pay | Admitting: Family Medicine

## 2022-03-23 ENCOUNTER — Other Ambulatory Visit: Payer: Self-pay | Admitting: Family Medicine

## 2022-03-23 DIAGNOSIS — N644 Mastodynia: Secondary | ICD-10-CM

## 2023-06-23 ENCOUNTER — Encounter (INDEPENDENT_AMBULATORY_CARE_PROVIDER_SITE_OTHER): Payer: Medicare Other | Admitting: Nurse Practitioner

## 2023-07-18 ENCOUNTER — Encounter (INDEPENDENT_AMBULATORY_CARE_PROVIDER_SITE_OTHER): Payer: Self-pay | Admitting: Nurse Practitioner

## 2023-09-05 ENCOUNTER — Other Ambulatory Visit: Payer: Self-pay | Admitting: Neurology

## 2023-09-05 DIAGNOSIS — R519 Headache, unspecified: Secondary | ICD-10-CM

## 2023-09-27 ENCOUNTER — Ambulatory Visit
Admission: RE | Admit: 2023-09-27 | Discharge: 2023-09-27 | Disposition: A | Discharge status: Home / Self Care | Payer: Medicare Other | Source: Ambulatory Visit | Attending: Neurology | Admitting: Neurology

## 2023-09-27 DIAGNOSIS — R519 Headache, unspecified: Secondary | ICD-10-CM

## 2023-10-03 ENCOUNTER — Other Ambulatory Visit: Payer: Self-pay | Admitting: Neurology

## 2023-10-03 DIAGNOSIS — R9389 Abnormal findings on diagnostic imaging of other specified body structures: Secondary | ICD-10-CM

## 2023-10-03 DIAGNOSIS — M5481 Occipital neuralgia: Secondary | ICD-10-CM

## 2023-10-06 ENCOUNTER — Other Ambulatory Visit

## 2023-10-10 ENCOUNTER — Ambulatory Visit
Admission: RE | Admit: 2023-10-10 | Discharge: 2023-10-10 | Disposition: A | Discharge status: Home / Self Care | Source: Ambulatory Visit | Attending: Neurology | Admitting: Neurology

## 2023-10-10 DIAGNOSIS — R9389 Abnormal findings on diagnostic imaging of other specified body structures: Secondary | ICD-10-CM | POA: Insufficient documentation

## 2023-10-10 DIAGNOSIS — M5481 Occipital neuralgia: Secondary | ICD-10-CM | POA: Insufficient documentation

## 2023-10-10 MED ORDER — GADOBUTROL 1 MMOL/ML IV SOLN
9.0000 mL | Freq: Once | INTRAVENOUS | Status: AC | PRN
  Administered 2023-10-10: 9 mL via INTRAVENOUS

## 2023-11-22 ENCOUNTER — Other Ambulatory Visit: Payer: Self-pay

## 2023-11-22 ENCOUNTER — Emergency Department

## 2023-11-22 ENCOUNTER — Emergency Department
Admission: EM | Admit: 2023-11-22 | Discharge: 2023-11-22 | Disposition: A | Discharge status: Home / Self Care | Attending: Emergency Medicine | Admitting: Emergency Medicine

## 2023-11-22 DIAGNOSIS — I509 Heart failure, unspecified: Secondary | ICD-10-CM | POA: Diagnosis not present

## 2023-11-22 DIAGNOSIS — S61411A Laceration without foreign body of right hand, initial encounter: Secondary | ICD-10-CM | POA: Diagnosis not present

## 2023-11-22 DIAGNOSIS — W228XXA Striking against or struck by other objects, initial encounter: Secondary | ICD-10-CM | POA: Diagnosis not present

## 2023-11-22 DIAGNOSIS — S0181XA Laceration without foreign body of other part of head, initial encounter: Secondary | ICD-10-CM | POA: Diagnosis not present

## 2023-11-22 DIAGNOSIS — S0990XA Unspecified injury of head, initial encounter: Secondary | ICD-10-CM

## 2023-11-22 DIAGNOSIS — S8012XA Contusion of left lower leg, initial encounter: Secondary | ICD-10-CM | POA: Diagnosis not present

## 2023-11-22 LAB — BASIC METABOLIC PANEL WITH GFR
Anion gap: 12 (ref 5–15)
BUN: 13 mg/dL (ref 8–23)
CO2: 22 mmol/L (ref 22–32)
Calcium: 9.1 mg/dL (ref 8.9–10.3)
Chloride: 99 mmol/L (ref 98–111)
Creatinine, Ser: 0.78 mg/dL (ref 0.44–1.00)
GFR, Estimated: >60 mL/min (ref >60)
Glucose, Bld: 96 mg/dL (ref 70–99)
Potassium: 3.2 mmol/L — ABNORMAL LOW (ref 3.5–5.1)
Sodium: 133 mmol/L — ABNORMAL LOW (ref 135–145)

## 2023-11-22 LAB — CBC
HCT: 32.5 % — ABNORMAL LOW (ref 36.0–46.0)
Hemoglobin: 10.4 g/dL — ABNORMAL LOW (ref 12.0–15.0)
MCH: 30.5 pg (ref 26.0–34.0)
MCHC: 32 g/dL (ref 30.0–36.0)
MCV: 95.3 fL (ref 80.0–100.0)
Platelets: 204 10*3/uL (ref 150–400)
RBC: 3.41 MIL/uL — ABNORMAL LOW (ref 3.87–5.11)
RDW: 13.2 % (ref 11.5–15.5)
WBC: 6.2 10*3/uL (ref 4.0–10.5)
nRBC: 0 % (ref 0.0–0.2)

## 2023-11-22 LAB — PROTIME-INR
INR: 3.6 — ABNORMAL HIGH (ref 0.8–1.2)
Prothrombin Time: 35.9 s — ABNORMAL HIGH (ref 11.4–15.2)

## 2023-11-22 MED ORDER — OXYCODONE-ACETAMINOPHEN 5-325 MG PO TABS
1.0000 | ORAL_TABLET | ORAL | Status: AC
  Administered 2023-11-22: 1 via ORAL
  Filled 2023-11-22: qty 1

## 2023-11-22 NOTE — ED Notes (Signed)
 [facility] called for transport to [facility] ,spoke with [employer]

## 2023-11-22 NOTE — ED Notes (Signed)
 [facility] at bedside.

## 2023-11-22 NOTE — ED Notes (Signed)
 Skin tear to R index finger covered with vaseline gauze and kerlix.

## 2023-11-22 NOTE — ED Notes (Signed)
 MD at bedside for laceration repair.

## 2023-11-22 NOTE — ED Triage Notes (Signed)
 Pt fell getting out of bed and hit her head on the corner of bedside table. Pt is on blood thinners. Neg LOC. Laceration on R forehead. Pt arrives axox4   186/91 71 100%

## 2023-11-22 NOTE — ED Notes (Signed)
 Pt placed on bedpan and able to urinate. Pt able to roll in bed independently. Perineal care done after bed pan and pt incontinence brief replaced. Bed in lowest position, rails up x2 and call bell within reach.

## 2023-11-22 NOTE — ED Notes (Signed)
 Assisted pt with bedpan. Pt able to role in bed independently

## 2023-11-22 NOTE — ED Provider Notes (Signed)
 [facility] Provider Note    Event Date/Time   First MD Initiated Contact with Patient 11/22/23 [phone]     (approximate)   History   Fall   HPI  Jennifer Guerra is a 89 y.o. female with a history of a mechanical heart valve, Coumadin  chronic use, congestive heart failure, occipital neuralgia  Patient was at her her nursing facility today.  She reports she has chronic weakness in her right arm and right leg, makes it difficult for her to get up.  She did so she got up this morning and in the process she lost her balance and fell striking her right forehead against the edge of the table.  She had brisk bleeding from the area.  Did not lose consciousness.  Otherwise in her normal health.  Denies any other injury except does have some bruising noted to her left lower leg with swelling per EMS as well.  EMS reports they had to place "combat gauze" over her right forehead laceration due to degree of bleeding.     Physical Exam   Triage Vital Signs: ED Triage Vitals  Encounter Vitals Group     BP 11/22/23 0758 (!) 181/47     Systolic BP Percentile --      Diastolic BP Percentile --      Pulse Rate 11/22/23 0758 63     Resp 11/22/23 0758 15     Temp 11/22/23 0758 98.3 F (36.8 C)     Temp Source 11/22/23 0758 Oral     SpO2 11/22/23 0758 100 %     Weight --      Height 11/22/23 0756 5\' 2"  (1.575 m)     Head Circumference --      Peak Flow --      Pain Score --      Pain Loc --      Pain Education --      Exclude from Growth Chart --     Most recent vital signs: Vitals:   11/22/23 1030 11/22/23 1100  BP: (!) 147/46 (!) 164/97  Pulse: 60 64  Resp:    Temp:    SpO2: 100% 100%     General: Awake, no distress.  She is very pleasant alert and oriented.  Normocephalic atraumatic with exception to an approximately 1 cm laceration of the right anterior forehead with risk arteriolar type bleeding and pulsatility.  This is over the right central forehead  region. CV:  Good peripheral perfusion.  Normal tones with exception to very prominent S1 Resp:  Normal effort.  Clear bilateral Abd:  No distention.  Soft nontender nondistended Other:  No obvious severe midline cervical tenderness but she does report feeling of soreness to palpation of the bilateral posterior occipital versus upper bilateral regions of the neck.  She demonstrates use of the right extremity and right leg but with debility and weakness, she reports this is not new and is a chronic issue for her.  She has had a long standing history of weakness in her right arm and right leg and is at her normal.  Her left arm she denies injury or difficulty with use.  Her left leg she reports normal use feels slightly sore she is able to demonstrate range of motion but there is moderate hematoma over the medial anterior shin without bleeding.  There is no deformity of any extremity.  Right hand right index finger has a proximately 1 cm superficial skin tear.  Demonstrates  good range of motion there is no deformity, no evidence to support bony injury.   ED Results / Procedures / Treatments   Labs (all labs ordered are listed, but only abnormal results are displayed) Labs Reviewed  PROTIME-INR - Abnormal; Notable for the following components:      Result Value   Prothrombin Time 35.9 (*)    INR 3.6 (*)    All other components within normal limits  CBC - Abnormal; Notable for the following components:   RBC 3.41 (*)    Hemoglobin 10.4 (*)    HCT 32.5 (*)    All other components within normal limits  BASIC METABOLIC PANEL WITH GFR - Abnormal; Notable for the following components:   Sodium 133 (*)    Potassium 3.2 (*)    All other components within normal limits     RADIOLOGY  CT of the head interpreted me as grossly negative for acute intracranial hemorrhage  DG Tibia/Fibula Left Result Date: 11/22/2023 CLINICAL DATA:  Left leg swelling after fall. EXAM: LEFT TIBIA AND FIBULA - 2 VIEW  COMPARISON:  None Available. FINDINGS: Status post left total knee arthroplasty. No fracture or dislocation is noted. Vascular calcifications are noted. IMPRESSION: No acute abnormality seen. Electronically Signed   By: Rosalene Colon M.D.   On: 11/22/2023 09:58   CT Head Wo Contrast Result Date: 11/22/2023 CLINICAL DATA:  Blunt facial trauma, fall getting out of bed, hit head on corner of table. EXAM: CT HEAD WITHOUT CONTRAST CT CERVICAL SPINE WITHOUT CONTRAST TECHNIQUE: Multidetector CT imaging of the head and cervical spine was performed following the standard protocol without intravenous contrast. Multiplanar CT image reconstructions of the cervical spine were also generated. RADIATION DOSE REDUCTION: This exam was performed according to the departmental dose-optimization program which includes automated exposure control, adjustment of the mA and/or kV according to patient size and/or use of iterative reconstruction technique. COMPARISON:  MRI head 09/27/2023, MRI cervical spine 10/10/2023. FINDINGS: CT HEAD FINDINGS Brain: No acute intracranial hemorrhage. No CT evidence of acute infarct. No edema, mass effect, or midline shift. The basilar cisterns are patent. Ventricles: The ventricles are normal. Vascular: Atherosclerotic calcifications of the carotid siphons. No hyperdense vessel. Skull: No acute or aggressive finding. Chronic nasal bone deformities. Orbits: Orbits are symmetric. Sinuses: Mild mucosal thickening in the ethmoid sinuses. Other: Mastoid air cells are clear. There is a small right frontal scalp hematoma with surrounding soft tissue swelling in the frontotemporal scalp. CT CERVICAL SPINE FINDINGS Alignment: Straightening of the normal cervical lordosis. 2-3 mm anterolisthesis of C6 on C7, C7 on T1, and T1 on T2 similar to prior. No facet subluxation or dislocation. Skull base and vertebrae: No compression fracture or displaced fracture in the cervical spine. Redemonstrated erosive change of  the dens slightly extending into the left lateral mass of C2. Additional associated soft tissue and mineralization along the dorsal aspect of the dens likely reflecting chronic degenerative changes. There is asymmetric degenerative change at the right atlantooccipital articulation. Degenerative changes of the bilateral atlantoaxial articulations more pronounced on the left. Anterior cervical fusion hardware at C5-6. No suspicious lytic or blastic osseous lesion. Soft tissues and spinal canal: No prevertebral fluid or swelling. No visible canal hematoma. Disc levels: Significant intervertebral disc space narrowing at multiple levels. Disc osteophyte complex at multiple levels without high-grade osseous spinal canal stenosis. Facet arthrosis and uncovertebral hypertrophy at multiple levels. Upper chest: Visualized lung apices are unremarkable. There is a 3.7 x 2.0 cm focus of masslike  soft tissue along the right aspect trachea abutting the right subclavian artery. Sequelae of median sternotomy. Other: None. IMPRESSION: No CT evidence of acute intracranial abnormality. No acute fracture or traumatic malalignment of the cervical spine. Right frontal scalp hematoma with surrounding soft tissue swelling. Degenerative changes of the cervical spine as above. 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 Electronically Signed   By: Denny Flack M.D.   On: 11/22/2023 09:45   CT Cervical Spine Wo Contrast Result Date: 11/22/2023 CLINICAL DATA:  Blunt facial trauma, fall getting out of bed, hit head on corner of table. EXAM: CT HEAD WITHOUT CONTRAST CT CERVICAL SPINE WITHOUT CONTRAST TECHNIQUE: Multidetector CT imaging of the head and cervical spine was performed following the standard protocol without intravenous contrast. Multiplanar CT image reconstructions of the cervical spine were also generated. RADIATION DOSE REDUCTION: This exam was performed according to the  departmental dose-optimization program which includes automated exposure control, adjustment of the mA and/or kV according to patient size and/or use of iterative reconstruction technique. COMPARISON:  MRI head 09/27/2023, MRI cervical spine 10/10/2023. FINDINGS: CT HEAD FINDINGS Brain: No acute intracranial hemorrhage. No CT evidence of acute infarct. No edema, mass effect, or midline shift. The basilar cisterns are patent. Ventricles: The ventricles are normal. Vascular: Atherosclerotic calcifications of the carotid siphons. No hyperdense vessel. Skull: No acute or aggressive finding. Chronic nasal bone deformities. Orbits: Orbits are symmetric. Sinuses: Mild mucosal thickening in the ethmoid sinuses. Other: Mastoid air cells are clear. There is a small right frontal scalp hematoma with surrounding soft tissue swelling in the frontotemporal scalp. CT CERVICAL SPINE FINDINGS Alignment: Straightening of the normal cervical lordosis. 2-3 mm anterolisthesis of C6 on C7, C7 on T1, and T1 on T2 similar to prior. No facet subluxation or dislocation. Skull base and vertebrae: No compression fracture or displaced fracture in the cervical spine. Redemonstrated erosive change of the dens slightly extending into the left lateral mass of C2. Additional associated soft tissue and mineralization along the dorsal aspect of the dens likely reflecting chronic degenerative changes. There is asymmetric degenerative change at the right atlantooccipital articulation. Degenerative changes of the bilateral atlantoaxial articulations more pronounced on the left. Anterior cervical fusion hardware at C5-6. No suspicious lytic or blastic osseous lesion. Soft tissues and spinal canal: No prevertebral fluid or swelling. No visible canal hematoma. Disc levels: Significant intervertebral disc space narrowing at multiple levels. Disc osteophyte complex at multiple levels without high-grade osseous spinal canal stenosis. Facet arthrosis and  uncovertebral hypertrophy at multiple levels. Upper chest: Visualized lung apices are unremarkable. There is a 3.7 x 2.0 cm focus of masslike soft tissue along the right aspect trachea abutting the right subclavian artery. Sequelae of median sternotomy. Other: None. IMPRESSION: No CT evidence of acute intracranial abnormality. No acute fracture or traumatic malalignment of the cervical spine. Right frontal scalp hematoma with surrounding soft tissue swelling. Degenerative changes of the cervical spine as above. 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 Electronically Signed   By: Denny Flack M.D.   On: 11/22/2023 09:45      PROCEDURES:  Critical Care performed: No  .Laceration Repair  Date/Time: 11/22/2023 8:41 AM  Performed by: Iver Marker, MD Authorized by: Iver Marker, MD   Consent:    Consent obtained:  Verbal   Consent given by:  Patient   Risks discussed:  Pain and poor wound healing Universal protocol:  Procedure explained and questions answered to patient or proxy's satisfaction: yes     Relevant documents present and verified: yes     Test results available: yes     Imaging studies available: yes     Required blood products, implants, devices, and special equipment available: yes     Patient identity confirmed:  Verbally with patient Anesthesia:    Anesthesia method:  Local infiltration   Local anesthetic:  Lidocaine 2% WITH epi (5 ml) Laceration details:    Location:  Face   Face location:  Forehead   Length (cm):  2.8   Depth (mm):  7 Pre-procedure details:    Preparation:  Patient was prepped and draped in usual sterile fashion and imaging obtained to evaluate for foreign bodies Exploration:    Limited defect created (wound extended): no     Hemostasis achieved with:  Epinephrine and direct pressure (surgicel (absorbale hemostat packed into wound))   Imaging obtained comment:  Ct   Imaging outcome:  foreign body not noted     Wound exploration: entire depth of wound visualized     Wound extent: areolar tissue violated and vascular damage     Wound extent: no foreign body     Contaminated: no   Treatment:    Area cleansed with:  Saline   Amount of cleaning:  Standard   Irrigation solution:  Sterile saline   Visualized foreign bodies/material removed: no     Debridement:  None Skin repair:    Repair method:  Sutures   Number of sutures: 7 (two 5-0) (two 2-0) non absorbable sutures. Approximation:    Approximation:  Close Repair type:    Repair type:  Intermediate (required packing with surgicel, epinephrine injections for bleeding control) Post-procedure details:    Dressing:  Bulky dressing   Procedure completion:  Tolerated Comments:     Patient had notable arteriolar type bleeding and pulsatile bleeding.  This was stopped with lidocaine injection and Surgicel packing with approximately 1 x 3 cm Surgicel gauze into the wound which was then oversewn with good effect and hemostasis.  3.6 INR  MEDICATIONS ORDERED IN ED: Medications  oxyCODONE -acetaminophen  (PERCOCET/ROXICET) 5-325 MG per tablet 1 tablet (1 tablet Oral Given 11/22/23 1610)     IMPRESSION / MDM / ASSESSMENT AND PLAN / ED COURSE  I reviewed the triage vital signs and the nursing notes.                              Differential diagnosis includes, but is not limited to, eye injury sustained from blunt head trauma, arteriolar bleeding from the scalp wound, less likely felt to have evidence of cervical injury she reports chronic right arm right leg weakness.  She reports no new debility.  She has evidence of hematoma over the left medial shin but no tense compartments, all compartments are soft normal neurovascular examination distally.  Laceration of the forehead was repaired with Surgicel packing and oversewing with good effect.  She is fully alert well-oriented without distress.  Patient's presentation is most  consistent with acute complicated illness / injury requiring diagnostic workup.  ----------------------------------------- 11:18 AM on 11/22/2023 ----------------------------------------- Workup reassuring.  Patient reports her nursing home care facility staff follow her INR.  CT head reassuring with no evidence intracranial injury  Patient alert well-oriented.  I took down the patient's head wrap, and right forehead wound is clean dry intact without further bleeding.  Additionally the patient  had bandaging applied to the right hand skin tear.  She is awake alert well-oriented comfortable plan for discharge and return to her care facility.  Return precautions and treatment recommendations and follow-up discussed with the patient who is agreeable with the plan.        FINAL CLINICAL IMPRESSION(S) / ED DIAGNOSES   Final diagnoses:  Skin tear of right hand without complication, initial encounter  Laceration of forehead, initial encounter  Closed head injury, initial encounter     Rx / DC Orders   ED Discharge Orders     None        Note:  This document was prepared using Dragon voice recognition software and may include unintentional dictation errors.   Iver Marker, MD 11/22/23 1119

## 2023-11-22 NOTE — ED Notes (Signed)
 Laceration to forehead approximated with 7 sutures covered with vaseline gauze and 4x4s, secured with kerlix and tape

## 2023-11-22 NOTE — Discharge Instructions (Addendum)
 For your home care team: Patient's INR was 3.6 today at the ER.  Additionally patient had 7 sutures placed that will need removal in approximately 10 days from the right forehead.  Recommend nonadherent dressings placed to the right forehead every 12 hours for at least the next 48 hours.
